# Patient Record
Sex: Male | Born: 1970
Health system: Southern US, Community
[De-identification: ages and names within clinical notes are randomized; demographics above are authoritative.]

## PROBLEM LIST (undated history)

## (undated) DIAGNOSIS — I4891 Unspecified atrial fibrillation: Secondary | ICD-10-CM

## (undated) DIAGNOSIS — N179 Acute kidney failure, unspecified: Secondary | ICD-10-CM

## (undated) DIAGNOSIS — E876 Hypokalemia: Secondary | ICD-10-CM

## (undated) HISTORY — PX: NO PAST SURGERIES: SHX2092

---

## 2016-02-23 DIAGNOSIS — I4891 Unspecified atrial fibrillation: Secondary | ICD-10-CM

## 2016-02-23 DIAGNOSIS — N179 Acute kidney failure, unspecified: Secondary | ICD-10-CM

## 2016-02-23 DIAGNOSIS — E876 Hypokalemia: Secondary | ICD-10-CM

## 2016-02-23 HISTORY — DX: Hypokalemia: E87.6

## 2016-02-23 HISTORY — DX: Acute kidney failure, unspecified: N17.9

## 2016-02-23 HISTORY — DX: Unspecified atrial fibrillation: I48.91

## 2016-03-15 ENCOUNTER — Encounter (HOSPITAL_BASED_OUTPATIENT_CLINIC_OR_DEPARTMENT_OTHER): Payer: Self-pay | Admitting: Emergency Medicine

## 2016-03-15 ENCOUNTER — Emergency Department (HOSPITAL_BASED_OUTPATIENT_CLINIC_OR_DEPARTMENT_OTHER)
Admission: EM | Admit: 2016-03-15 | Discharge: 2016-03-15 | Disposition: A | Discharge status: Home / Self Care | Payer: BLUE CROSS/BLUE SHIELD | Attending: Emergency Medicine | Admitting: Emergency Medicine

## 2016-03-15 DIAGNOSIS — R945 Abnormal results of liver function studies: Secondary | ICD-10-CM | POA: Diagnosis not present

## 2016-03-15 DIAGNOSIS — R112 Nausea with vomiting, unspecified: Secondary | ICD-10-CM | POA: Insufficient documentation

## 2016-03-15 DIAGNOSIS — R7989 Other specified abnormal findings of blood chemistry: Secondary | ICD-10-CM

## 2016-03-15 DIAGNOSIS — R109 Unspecified abdominal pain: Secondary | ICD-10-CM | POA: Diagnosis present

## 2016-03-15 LAB — CBC WITH DIFFERENTIAL/PLATELET
BASOS ABS: 0 10*3/uL (ref 0.0–0.1)
Basophils Relative: 0 %
EOS PCT: 0 %
Eosinophils Absolute: 0 10*3/uL (ref 0.0–0.7)
HCT: 39.9 % (ref 39.0–52.0)
HEMOGLOBIN: 13.7 g/dL (ref 13.0–17.0)
LYMPHS ABS: 0.7 10*3/uL (ref 0.7–4.0)
Lymphocytes Relative: 6 %
MCH: 30 pg (ref 26.0–34.0)
MCHC: 34.3 g/dL (ref 30.0–36.0)
MCV: 87.3 fL (ref 78.0–100.0)
MONOS PCT: 5 %
Monocytes Absolute: 0.6 10*3/uL (ref 0.1–1.0)
Neutro Abs: 10.6 10*3/uL — ABNORMAL HIGH (ref 1.7–7.7)
Neutrophils Relative %: 89 %
PLATELETS: 173 10*3/uL (ref 150–400)
RBC: 4.57 MIL/uL (ref 4.22–5.81)
RDW: 13.3 % (ref 11.5–15.5)
WBC: 11.9 10*3/uL — ABNORMAL HIGH (ref 4.0–10.5)

## 2016-03-15 LAB — URINE MICROSCOPIC-ADD ON: Bacteria, UA: NONE SEEN

## 2016-03-15 LAB — URINALYSIS, ROUTINE W REFLEX MICROSCOPIC
Bilirubin Urine: NEGATIVE
GLUCOSE, UA: NEGATIVE mg/dL
Ketones, ur: 40 mg/dL — AB
LEUKOCYTES UA: NEGATIVE
Nitrite: NEGATIVE
PROTEIN: 100 mg/dL — AB
SPECIFIC GRAVITY, URINE: 1.025 (ref 1.005–1.030)
pH: 6 (ref 5.0–8.0)

## 2016-03-15 LAB — COMPREHENSIVE METABOLIC PANEL
ALBUMIN: 4.5 g/dL (ref 3.5–5.0)
ALK PHOS: 106 U/L (ref 38–126)
ALT: 98 U/L — AB (ref 17–63)
AST: 124 U/L — ABNORMAL HIGH (ref 15–41)
Anion gap: 9 (ref 5–15)
BILIRUBIN TOTAL: 2.1 mg/dL — AB (ref 0.3–1.2)
BUN: 19 mg/dL (ref 6–20)
CALCIUM: 9.9 mg/dL (ref 8.9–10.3)
CO2: 24 mmol/L (ref 22–32)
CREATININE: 1.77 mg/dL — AB (ref 0.61–1.24)
Chloride: 104 mmol/L (ref 101–111)
GFR calc Af Amer: 51 mL/min — ABNORMAL LOW (ref >60)
GFR calc non Af Amer: 44 mL/min — ABNORMAL LOW (ref >60)
GLUCOSE: 134 mg/dL — AB (ref 65–99)
Potassium: 4.2 mmol/L (ref 3.5–5.1)
SODIUM: 137 mmol/L (ref 135–145)
TOTAL PROTEIN: 8 g/dL (ref 6.5–8.1)

## 2016-03-15 LAB — LIPASE, BLOOD: LIPASE: 18 U/L (ref 11–51)

## 2016-03-15 MED ORDER — ONDANSETRON 8 MG PO TBDP: 8.0000 mg | ORAL_TABLET | Freq: Three times a day (TID) | ORAL | Status: DC | PRN

## 2016-03-15 MED ORDER — ONDANSETRON HCL 4 MG/2ML IJ SOLN
4.0000 mg | Freq: Once | INTRAMUSCULAR | Status: AC
  Administered 2016-03-15: 4 mg via INTRAVENOUS
  Filled 2016-03-15: qty 2

## 2016-03-15 MED ORDER — FENTANYL CITRATE (PF) 100 MCG/2ML IJ SOLN
100.0000 ug | Freq: Once | INTRAMUSCULAR | Status: AC
  Administered 2016-03-15: 100 ug via INTRAVENOUS
  Filled 2016-03-15: qty 2

## 2016-03-15 MED ORDER — SODIUM CHLORIDE 0.9 % IV BOLUS (SEPSIS)
1000.0000 mL | Freq: Once | INTRAVENOUS | Status: AC
  Administered 2016-03-15: 1000 mL via INTRAVENOUS

## 2016-03-15 NOTE — ED Notes (Signed)
Patient states that he ate at a [place] today and since he has had N/v and right sided abdominal pain

## 2016-03-15 NOTE — ED Notes (Signed)
Tolerated oral trial without difficulty

## 2016-03-15 NOTE — ED Notes (Signed)
Pt given discharge instructions. Pt given opportunity for questions.

## 2016-03-15 NOTE — ED Provider Notes (Signed)
CSN: [id number]     Arrival date & time 03/15/16  0508 History   First MD Initiated Contact with Patient 03/15/16 0530     Chief Complaint  Patient presents with  . Abdominal Pain     (Consider location/radiation/quality/duration/timing/severity/associated sxs/prior Treatment) HPI  This is a 45 year old male who developed nausea yesterday afternoon about 4 PM. He attributes this to eating [place] food about 1:30 PM yesterday. He subsequently developed vomiting and has vomited at least 5 times. He is also having some right-sided abdominal pain which he describes as cramping or spasms. This pain is not worse with palpation. This is moderate in severity. He denies fever, chills, shortness of breath and diarrhea. He tried taking Tylenol for his pain but threw this up.  History reviewed. No pertinent past medical history. History reviewed. No pertinent past surgical history. History reviewed. No pertinent family history. Social History  Substance Use Topics  . Smoking status: Never Smoker   . Smokeless tobacco: None  . Alcohol Use: No    Review of Systems  All other systems reviewed and are negative.   Allergies  Review of patient's allergies indicates no known allergies.  Home Medications   Prior to Admission medications   Medication Sig Start Date End Date Taking? Authorizing Provider  ondansetron (ZOFRAN ODT) 8 MG disintegrating tablet Take 1 tablet (8 mg total) by mouth every 8 (eight) hours as needed for nausea or vomiting. 03/15/16   Shavonn Convey, MD   BP 189/122 mmHg  Pulse 70  Temp(Src) 98.3 F (36.8 C) (Oral)  Resp 20  Ht  (1.803 m)  Wt 210 lb (95.255 kg)  BMI 29.30 kg/m2  SpO2 100%   Physical Exam  General: Well-developed, well-nourished male in no acute distress; appearance consistent with age of record HENT: normocephalic; atraumatic Eyes: pupils equal, round and reactive to light; extraocular muscles intact Neck: supple Heart: regular rate and  rhythm Lungs: clear to auscultation bilaterally Abdomen: soft; nondistended; nontender, negative Murphy sign; no masses or hepatosplenomegaly; bowel sounds present Extremities: No deformity; full range of motion; pulses normal Neurologic: Awake, alert and oriented; motor function intact in all extremities and symmetric; no facial droop Skin: Warm and dry Psychiatric: Normal mood and affect    ED Course  Procedures (including critical care time)   MDM  Nursing notes and vitals signs, including pulse oximetry, reviewed.  Summary of this visit's results, reviewed by myself:  Labs:  Results for orders placed or performed during the hospital encounter of 03/15/16 (from the past 24 hour(s))  CBC with Differential/Platelet     Status: Abnormal   Collection Time: 03/15/16  5:40 AM  Result Value Ref Range   WBC 11.9 (H) 4.0 - 10.5 K/uL   RBC 4.57 4.22 - 5.81 MIL/uL   Hemoglobin 13.7 13.0 - 17.0 g/dL   HCT 11.9 14.7 - 82.9 %   MCV 87.3 78.0 - 100.0 fL   MCH 30.0 26.0 - 34.0 pg   MCHC 34.3 30.0 - 36.0 g/dL   RDW 56.2 13.0 - 86.5 %   Platelets 173 150 - 400 K/uL   Neutrophils Relative % 89 %   Neutro Abs 10.6 (H) 1.7 - 7.7 K/uL   Lymphocytes Relative 6 %   Lymphs Abs 0.7 0.7 - 4.0 K/uL   Monocytes Relative 5 %   Monocytes Absolute 0.6 0.1 - 1.0 K/uL   Eosinophils Relative 0 %   Eosinophils Absolute 0.0 0.0 - 0.7 K/uL   Basophils Relative 0 %  Basophils Absolute 0.0 0.0 - 0.1 K/uL  Comprehensive metabolic panel     Status: Abnormal   Collection Time: 03/15/16  5:40 AM  Result Value Ref Range   Sodium 137 135 - 145 mmol/L   Potassium 4.2 3.5 - 5.1 mmol/L   Chloride 104 101 - 111 mmol/L   CO2 24 22 - 32 mmol/L   Glucose, Bld 134 (H) 65 - 99 mg/dL   BUN 19 6 - 20 mg/dL   Creatinine, Ser 4.17 (H) 0.61 - 1.24 mg/dL   Calcium 9.9 8.9 - 40.8 mg/dL   Total Protein 8.0 6.5 - 8.1 g/dL   Albumin 4.5 3.5 - 5.0 g/dL   AST 144 (H) 15 - 41 U/L   ALT 98 (H) 17 - 63 U/L   Alkaline  Phosphatase 106 38 - 126 U/L   Total Bilirubin 2.1 (H) 0.3 - 1.2 mg/dL   GFR calc non Af Amer 44 (L) >60 mL/min   GFR calc Af Amer 51 (L) >60 mL/min   Anion gap 9 5 - 15  Lipase, blood     Status: None   Collection Time: 03/15/16  5:40 AM  Result Value Ref Range   Lipase 18 11 - 51 U/L  Urinalysis, Routine w reflex microscopic (not at [facility])     Status: Abnormal   Collection Time: 03/15/16  6:20 AM  Result Value Ref Range   Color, Urine YELLOW YELLOW   APPearance CLEAR CLEAR   Specific Gravity, Urine 1.025 1.005 - 1.030   pH 6.0 5.0 - 8.0   Glucose, UA NEGATIVE NEGATIVE mg/dL   Hgb urine dipstick LARGE (A) NEGATIVE   Bilirubin Urine NEGATIVE NEGATIVE   Ketones, ur 40 (A) NEGATIVE mg/dL   Protein, ur 818 (A) NEGATIVE mg/dL   Nitrite NEGATIVE NEGATIVE   Leukocytes, UA NEGATIVE NEGATIVE  Urine microscopic-add on     Status: Abnormal   Collection Time: 03/15/16  6:20 AM  Result Value Ref Range   Squamous Epithelial / LPF 0-5 (A) NONE SEEN   WBC, UA 0-5 0 - 5 WBC/hpf   RBC / HPF 6-30 0 - 5 RBC/hpf   Bacteria, UA NONE SEEN NONE SEEN   Urine-Other      RENAL TUBULAR EPI'S MAY BE PRESENT. SAMPLE TO BE SENT FOR PATH REVIEW.   6:55 AM Patient drinking fluids without emesis after IV Zofran and fluids. Advised of mildly elevated liver enzymes. Was advised to follow-up with primary care physician to have these reevaluated or to return to the ED if symptoms worsen.     Paula Libra, MD 03/15/16 2258

## 2016-03-15 NOTE — Discharge Instructions (Signed)
There were some abnormalities in you laboratory studies. You liver enzymes were mildly elevated. You need to follow up with a primary care physician to have this further evaluated.

## 2016-03-17 LAB — PATHOLOGIST SMEAR REVIEW

## 2016-03-18 DIAGNOSIS — R1011 Right upper quadrant pain: Secondary | ICD-10-CM | POA: Diagnosis not present

## 2016-03-19 ENCOUNTER — Observation Stay (HOSPITAL_BASED_OUTPATIENT_CLINIC_OR_DEPARTMENT_OTHER)
Admission: EM | Admit: 2016-03-19 | Discharge: 2016-03-21 | Disposition: A | Discharge status: Home / Self Care | Payer: BLUE CROSS/BLUE SHIELD | Attending: Internal Medicine | Admitting: Internal Medicine

## 2016-03-19 ENCOUNTER — Emergency Department (HOSPITAL_BASED_OUTPATIENT_CLINIC_OR_DEPARTMENT_OTHER): Payer: BLUE CROSS/BLUE SHIELD

## 2016-03-19 ENCOUNTER — Encounter (HOSPITAL_BASED_OUTPATIENT_CLINIC_OR_DEPARTMENT_OTHER): Payer: Self-pay | Admitting: *Deleted

## 2016-03-19 DIAGNOSIS — E876 Hypokalemia: Secondary | ICD-10-CM | POA: Diagnosis present

## 2016-03-19 DIAGNOSIS — R109 Unspecified abdominal pain: Secondary | ICD-10-CM | POA: Diagnosis present

## 2016-03-19 DIAGNOSIS — I4891 Unspecified atrial fibrillation: Principal | ICD-10-CM

## 2016-03-19 DIAGNOSIS — R509 Fever, unspecified: Secondary | ICD-10-CM | POA: Diagnosis not present

## 2016-03-19 DIAGNOSIS — H1132 Conjunctival hemorrhage, left eye: Secondary | ICD-10-CM | POA: Diagnosis not present

## 2016-03-19 DIAGNOSIS — R778 Other specified abnormalities of plasma proteins: Secondary | ICD-10-CM | POA: Diagnosis present

## 2016-03-19 DIAGNOSIS — D72829 Elevated white blood cell count, unspecified: Secondary | ICD-10-CM | POA: Diagnosis present

## 2016-03-19 DIAGNOSIS — N179 Acute kidney failure, unspecified: Secondary | ICD-10-CM

## 2016-03-19 DIAGNOSIS — R112 Nausea with vomiting, unspecified: Secondary | ICD-10-CM | POA: Diagnosis present

## 2016-03-19 DIAGNOSIS — R7989 Other specified abnormal findings of blood chemistry: Secondary | ICD-10-CM | POA: Diagnosis present

## 2016-03-19 HISTORY — DX: Hypokalemia: E87.6

## 2016-03-19 HISTORY — DX: Unspecified atrial fibrillation: I48.91

## 2016-03-19 HISTORY — DX: Acute kidney failure, unspecified: N17.9

## 2016-03-19 LAB — CBC WITH DIFFERENTIAL/PLATELET
Basophils Absolute: 0 10*3/uL (ref 0.0–0.1)
Basophils Relative: 0 %
EOS PCT: 3 %
Eosinophils Absolute: 0.3 10*3/uL (ref 0.0–0.7)
HCT: 40.8 % (ref 39.0–52.0)
Hemoglobin: 14.1 g/dL (ref 13.0–17.0)
LYMPHS ABS: 1.9 10*3/uL (ref 0.7–4.0)
LYMPHS PCT: 17 %
MCH: 29.3 pg (ref 26.0–34.0)
MCHC: 34.6 g/dL (ref 30.0–36.0)
MCV: 84.8 fL (ref 78.0–100.0)
MONO ABS: 1.4 10*3/uL — AB (ref 0.1–1.0)
MONOS PCT: 12 %
Neutro Abs: 7.7 10*3/uL (ref 1.7–7.7)
Neutrophils Relative %: 68 %
PLATELETS: 217 10*3/uL (ref 150–400)
RBC: 4.81 MIL/uL (ref 4.22–5.81)
RDW: 12.7 % (ref 11.5–15.5)
WBC: 11.3 10*3/uL — ABNORMAL HIGH (ref 4.0–10.5)

## 2016-03-19 LAB — COMPREHENSIVE METABOLIC PANEL
ALT: 60 U/L (ref 17–63)
ANION GAP: 13 (ref 5–15)
AST: 32 U/L (ref 15–41)
Albumin: 4 g/dL (ref 3.5–5.0)
Alkaline Phosphatase: 90 U/L (ref 38–126)
BUN: 24 mg/dL — ABNORMAL HIGH (ref 6–20)
CHLORIDE: 96 mmol/L — AB (ref 101–111)
CO2: 23 mmol/L (ref 22–32)
CREATININE: 2.11 mg/dL — AB (ref 0.61–1.24)
Calcium: 9.2 mg/dL (ref 8.9–10.3)
GFR, EST AFRICAN AMERICAN: 41 mL/min — AB (ref >60)
GFR, EST NON AFRICAN AMERICAN: 36 mL/min — AB (ref >60)
Glucose, Bld: 96 mg/dL (ref 65–99)
POTASSIUM: 3.4 mmol/L — AB (ref 3.5–5.1)
Sodium: 132 mmol/L — ABNORMAL LOW (ref 135–145)
Total Bilirubin: 1.7 mg/dL — ABNORMAL HIGH (ref 0.3–1.2)
Total Protein: 7.8 g/dL (ref 6.5–8.1)

## 2016-03-19 MED ORDER — DILTIAZEM HCL 100 MG IV SOLR
5.0000 mg/h | Freq: Once | INTRAVENOUS | Status: AC
  Administered 2016-03-19: 5 mg/h via INTRAVENOUS
  Filled 2016-03-19: qty 100

## 2016-03-19 MED ORDER — SODIUM CHLORIDE 0.9 % IV BOLUS (SEPSIS)
500.0000 mL | Freq: Once | INTRAVENOUS | Status: AC
  Administered 2016-03-19: 500 mL via INTRAVENOUS

## 2016-03-19 MED ORDER — DILTIAZEM HCL 100 MG IV SOLR: 5.0000 mg/h | Freq: Once | INTRAVENOUS | Status: DC

## 2016-03-19 MED ORDER — DILTIAZEM HCL 25 MG/5ML IV SOLN
20.0000 mg | Freq: Once | INTRAVENOUS | Status: AC
  Administered 2016-03-19: 10 mg via INTRAVENOUS

## 2016-03-19 NOTE — Progress Notes (Signed)
This is a no charge note   Transfer from [facility] per dr. Fredderick Phenix  45 year old man without significant past medical history, who presents with subjective fever and abnormal lab.  Pt was seen in the ED 4 days ago due to nausea, vomiting, abdominal pain over RUQ. He had negative lipase and negative urinalysis. His nausea, vomiting and abdominal pain had resolved. He was seen by PCP for follow up today, and had labs drawn at his PCP's office. He was told that his WBC was elevated; pt was then directed to the ED to be further evaluated. Pt states that he will have diaphoresis/hot flashes at night when trying to sleep. Pt had a fever of 100 F last night that has since resolved. No abdominal pain, chest pain, shortness breath, dizziness, symptoms of UTI, rash or unilateral weakness.  Pt was found to have Afib with RVR with HR up to 150s and cardzem gtt was started. Still no CP or SOB. WBC 11.3, temperature normal, Na=132, K3.4, AKI with cre 2.17. Total bilirubin 1.7, AST 32, ALT 60. Pt is accepted to SDU for observation.   Lorretta Harp, MD  [facility] Pager [phone]  If 7PM-7AM, please contact night-coverage [email or web address] Password [facility] 03/19/2016, 11:47 PM

## 2016-03-19 NOTE — ED Notes (Signed)
At the bedside. EKG redone r/t changes in the rate of and the patient rate. Patient remains tachycardic at 117 to 120 - AFlutter noted. Multiple PVCs noted and multifocal in nature. MD aware. Bolus of 10 given per the verbal orders and BP rechecked.

## 2016-03-19 NOTE — ED Notes (Signed)
Upon completion of EKG pt is noted to be in a-fib rate 130-140. Pt denies CP, SOB, lightheadedness, dizziness, or any physical sx at all.

## 2016-03-19 NOTE — ED Provider Notes (Signed)
[facility] Provider Note   CSN: [id number] Arrival date & time: 03/19/16  2211  By signing my name below, I, Phillis Haggis, attest that this documentation has been prepared under the direction and in the presence of Rolan Bucco, MD. Electronically Signed: Phillis Haggis, ED Scribe. 03/19/16. 10:40 PM.  First Provider Contact:  None    History   Chief Complaint Chief Complaint  Patient presents with  . Abnormal Lab   The history is provided by the patient. No language interpreter was used.   HPI Comments: Mark Ponce is a 45 y.o. male who presents to the Emergency Department complaining of abnormal labs. Pt was seen in the ED 4 days ago for emesis.  He thought it was related to something that he ate. He had a short bout of pain in his right upper quadrant but has not had any abdominal pain since that time. Pt has not had any vomiting since. No diarrhea. Labs were drawn at his PCP's office today and he was told that his WBC was elevated; pt was then directed to the ED to be further evaluated. Pt states that he will have diaphoresis/hot flashes at night when trying to sleep. Pt had a fever tmax 100 F last night that has since resolved. He denies hx of abdominal surgeries, chills, rhinorrhea, congestion, cough, nausea, vomiting, diarrhea, abdominal pain, chest pain, SOB, dizziness, lightheadedness, or rash.   History reviewed. No pertinent past medical history.  Patient Active Problem List   Diagnosis Date Noted  . Atrial fibrillation with RVR (HCC) 03/19/2016  . AKI (acute kidney injury) (HCC) 03/19/2016  . Atrial fibrillation (HCC) 03/19/2016    History reviewed. No pertinent surgical history.     Home Medications    Prior to Admission medications   Medication Sig Start Date End Date Taking? Authorizing Provider  ondansetron (ZOFRAN ODT) 8 MG disintegrating tablet Take 1 tablet (8 mg total) by mouth every 8 (eight) hours as needed for nausea or vomiting.  03/15/16   Paula Libra, MD    Family History History reviewed. No pertinent family history.  Social History Social History  Substance Use Topics  . Smoking status: Never Smoker  . Smokeless tobacco: Not on file  . Alcohol use No     Allergies   Review of patient's allergies indicates no known allergies.   Review of Systems Review of Systems  Constitutional: Positive for diaphoresis and fever. Negative for chills and fatigue.  HENT: Negative for congestion, rhinorrhea and sneezing.   Eyes: Negative.   Respiratory: Negative for cough, chest tightness and shortness of breath.   Cardiovascular: Negative for chest pain and leg swelling.  Gastrointestinal: Negative for abdominal pain, blood in stool, diarrhea, nausea and vomiting.  Genitourinary: Negative for difficulty urinating, flank pain, frequency and hematuria.  Musculoskeletal: Negative for arthralgias and back pain.  Skin: Negative for rash.  Neurological: Negative for dizziness, speech difficulty, weakness, light-headedness, numbness and headaches.   Physical Exam Updated Vital Signs BP 128/97 (BP Location: Right Arm)   Pulse (!) 123   Temp 98.6 F (37 C) (Oral)   Resp 12   Ht  (1.803 m)   Wt 220 lb (99.8 kg)   SpO2 100%   BMI 30.68 kg/m   Physical Exam  Constitutional: He is oriented to person, place, and time. He appears well-developed and well-nourished.  HENT:  Head: Normocephalic and atraumatic.  Eyes: Pupils are equal, round, and reactive to light. Left conjunctiva has a hemorrhage.  Small subconjunctival hemorrhage to the left eye  Neck: Normal range of motion. Neck supple.  Cardiovascular: Normal heart sounds.  An irregularly irregular rhythm present. Tachycardia present.   Pulmonary/Chest: Effort normal and breath sounds normal. No respiratory distress. He has no wheezes. He has no rales. He exhibits no tenderness.  Abdominal: Soft. Bowel sounds are normal. There is no tenderness. There is no  rebound and no guarding.  Musculoskeletal: Normal range of motion. He exhibits no edema.  Lymphadenopathy:    He has no cervical adenopathy.  Neurological: He is alert and oriented to person, place, and time.  Skin: Skin is warm and dry. No rash noted.  Psychiatric: He has a normal mood and affect.     ED Treatments / Results  DIAGNOSTIC STUDIES: Oxygen Saturation is 100% on RA, normal by my interpretation.    COORDINATION OF CARE: 10:35 PM-Discussed treatment plan which includes labs and EKG with pt at bedside and pt agreed to plan.    Labs (all labs ordered are listed, but only abnormal results are displayed) Results for orders placed or performed during the hospital encounter of 03/19/16  CBC with Differential  Result Value Ref Range   WBC 11.3 (H) 4.0 - 10.5 K/uL   RBC 4.81 4.22 - 5.81 MIL/uL   Hemoglobin 14.1 13.0 - 17.0 g/dL   HCT 16.1 09.6 - 04.5 %   MCV 84.8 78.0 - 100.0 fL   MCH 29.3 26.0 - 34.0 pg   MCHC 34.6 30.0 - 36.0 g/dL   RDW 40.9 81.1 - 91.4 %   Platelets 217 150 - 400 K/uL   Neutrophils Relative % 68 %   Neutro Abs 7.7 1.7 - 7.7 K/uL   Lymphocytes Relative 17 %   Lymphs Abs 1.9 0.7 - 4.0 K/uL   Monocytes Relative 12 %   Monocytes Absolute 1.4 (H) 0.1 - 1.0 K/uL   Eosinophils Relative 3 %   Eosinophils Absolute 0.3 0.0 - 0.7 K/uL   Basophils Relative 0 %   Basophils Absolute 0.0 0.0 - 0.1 K/uL  Comprehensive metabolic panel  Result Value Ref Range   Sodium 132 (L) 135 - 145 mmol/L   Potassium 3.4 (L) 3.5 - 5.1 mmol/L   Chloride 96 (L) 101 - 111 mmol/L   CO2 23 22 - 32 mmol/L   Glucose, Bld 96 65 - 99 mg/dL   BUN 24 (H) 6 - 20 mg/dL   Creatinine, Ser 7.82 (H) 0.61 - 1.24 mg/dL   Calcium 9.2 8.9 - 95.6 mg/dL   Total Protein 7.8 6.5 - 8.1 g/dL   Albumin 4.0 3.5 - 5.0 g/dL   AST 32 15 - 41 U/L   ALT 60 17 - 63 U/L   Alkaline Phosphatase 90 38 - 126 U/L   Total Bilirubin 1.7 (H) 0.3 - 1.2 mg/dL   GFR calc non Af Amer 36 (L) >60 mL/min   GFR calc  Af Amer 41 (L) >60 mL/min   Anion gap 13 5 - 15   Dg Chest Port 1 View  Result Date: 03/19/2016 CLINICAL DATA:  45 year old male with atrial fibrillation. No chest complaints. EXAM: PORTABLE CHEST 1 VIEW COMPARISON:  None FINDINGS: Single portable view of the chest demonstrates clear lungs. There is no pleural effusion or pneumothorax. Top-normal cardiac silhouette. No acute osseous pathology. IMPRESSION: No active disease. Electronically Signed   By: Elgie Collard M.D.   On: 03/19/2016 23:26    EKG  EKG Interpretation  Date/Time:  Wednesday March 19 2016 23:03:17 EDT Ventricular Rate:  117 PR Interval:    QRS Duration: 81 QT Interval:  311 QTC Calculation: 434 R Axis:   38 Text Interpretation:  Atrial fibrillation Paired ventricular premature complexes Probable LVH with secondary repol abnrm Anterior ST elevation, probably due to LVH since last tracing no significant change from EKG from same day Confirmed by Sina Sumpter  MD, Marijo Quizon ([id number]) on 03/19/2016 11:07:29 PM       Radiology Dg Chest Port 1 View  Result Date: 03/19/2016 CLINICAL DATA:  45 year old male with atrial fibrillation. No chest complaints. EXAM: PORTABLE CHEST 1 VIEW COMPARISON:  None FINDINGS: Single portable view of the chest demonstrates clear lungs. There is no pleural effusion or pneumothorax. Top-normal cardiac silhouette. No acute osseous pathology. IMPRESSION: No active disease. Electronically Signed   By: Elgie Collard M.D.   On: 03/19/2016 23:26   Procedures Procedures (including critical care time)  Medications Ordered in ED Medications  sodium chloride 0.9 % bolus 500 mL (500 mLs Intravenous New Bag/Given 03/19/16 2311)  diltiazem (CARDIZEM) injection 20 mg (10 mg Intravenous Bolus 03/19/16 2313)  diltiazem (CARDIZEM) 100 mg in dextrose 5 % 100 mL (1 mg/mL) infusion (5 mg/hr Intravenous New Bag/Given 03/19/16 2311)     Initial Impression / Assessment and Plan / ED Course  I have reviewed the triage  vital signs and the nursing notes.  Pertinent labs & imaging results that were available during my care of the patient were reviewed by me and considered in my medical decision making (see chart for details).  Clinical Course    Patient presents as a follow-up as he was told that his white blood cell count was elevated. Otherwise he has no abdominal pain. No ongoing vomiting or diarrhea. On exam he was noted to be tachycardic and EKG was performed. EKG shows atrial fibrillation with RVR. He denies any known history of A. fib. He statesthat he has been feeling intermittent palpitations but can't pinpoint when they started. He denies any chest pain shortness of breath or dizziness. I will go ahead and start him on Cardizem and consult the hospitalist to transfer to [facility]. Of note his creatinine is elevated as compared to his previous ED visit 4 days ago. I will give him some IV fluids as well.  23:20 pt given cardizem bolus, started on drip.  HR down into 80s, still in a-fib. 23:43 Discussed with Dr. Youlanda Roys who will admit pt  CRITICAL CARE Performed by: Rebecca Motta Total critical care time: 45 minutes Critical care time was exclusive of separately billable procedures and treating other patients. Critical care was necessary to treat or prevent imminent or life-threatening deterioration. Critical care was time spent personally by me on the following activities: development of treatment plan with patient and/or surrogate as well as nursing, discussions with consultants, evaluation of patient's response to treatment, examination of patient, obtaining history from patient or surrogate, ordering and performing treatments and interventions, ordering and review of laboratory studies, ordering and review of radiographic studies, pulse oximetry and re-evaluation of patient's condition.   This patients CHA2DS2-VASc Score and unadjusted Ischemic Stroke Rate (% per year) is equal to 0.2 % stroke rate/year  from a score of 0  Above score calculated as 1 point each if present [CHF, HTN, DM, Vascular=MI/PAD/Aortic Plaque, Age if 65-74, or Male] Above score calculated as 2 points each if present [Age > 75, or Stroke/TIA/TE]    Final Clinical Impressions(s) / ED Diagnoses   Final diagnoses:  New  onset atrial fibrillation (HCC)  AKI (acute kidney injury) (HCC)  I personally performed the services described in this documentation, which was scribed in my presence.  The recorded information has been reviewed and considered.   New Prescriptions New Prescriptions   No medications on file     Rolan Bucco, MD 03/19/16 [phone]

## 2016-03-19 NOTE — ED Triage Notes (Addendum)
Pt seen here 4 days ago, f/u with PMD yesterday and labs drawn. PMD instructed pt to come to ED for increased WBC, pt has no complaints

## 2016-03-20 ENCOUNTER — Observation Stay (HOSPITAL_COMMUNITY): Payer: BLUE CROSS/BLUE SHIELD

## 2016-03-20 ENCOUNTER — Observation Stay (HOSPITAL_BASED_OUTPATIENT_CLINIC_OR_DEPARTMENT_OTHER): Payer: BLUE CROSS/BLUE SHIELD

## 2016-03-20 ENCOUNTER — Encounter (HOSPITAL_COMMUNITY): Payer: Self-pay | Admitting: *Deleted

## 2016-03-20 DIAGNOSIS — R7989 Other specified abnormal findings of blood chemistry: Secondary | ICD-10-CM

## 2016-03-20 DIAGNOSIS — I4891 Unspecified atrial fibrillation: Secondary | ICD-10-CM

## 2016-03-20 DIAGNOSIS — R109 Unspecified abdominal pain: Secondary | ICD-10-CM | POA: Diagnosis present

## 2016-03-20 DIAGNOSIS — R509 Fever, unspecified: Secondary | ICD-10-CM | POA: Diagnosis not present

## 2016-03-20 DIAGNOSIS — E876 Hypokalemia: Secondary | ICD-10-CM | POA: Diagnosis not present

## 2016-03-20 DIAGNOSIS — N179 Acute kidney failure, unspecified: Secondary | ICD-10-CM | POA: Diagnosis not present

## 2016-03-20 DIAGNOSIS — R112 Nausea with vomiting, unspecified: Secondary | ICD-10-CM | POA: Diagnosis present

## 2016-03-20 DIAGNOSIS — I517 Cardiomegaly: Secondary | ICD-10-CM | POA: Diagnosis not present

## 2016-03-20 DIAGNOSIS — R778 Other specified abnormalities of plasma proteins: Secondary | ICD-10-CM | POA: Diagnosis present

## 2016-03-20 LAB — URINALYSIS, ROUTINE W REFLEX MICROSCOPIC
BILIRUBIN URINE: NEGATIVE
GLUCOSE, UA: NEGATIVE mg/dL
KETONES UR: 15 mg/dL — AB
Nitrite: NEGATIVE
PH: 5.5 (ref 5.0–8.0)
Protein, ur: 30 mg/dL — AB
SPECIFIC GRAVITY, URINE: 1.013 (ref 1.005–1.030)

## 2016-03-20 LAB — RAPID URINE DRUG SCREEN, HOSP PERFORMED
AMPHETAMINES: NOT DETECTED
BARBITURATES: NOT DETECTED
BENZODIAZEPINES: NOT DETECTED
COCAINE: NOT DETECTED
OPIATES: NOT DETECTED
TETRAHYDROCANNABINOL: NOT DETECTED

## 2016-03-20 LAB — BASIC METABOLIC PANEL
Anion gap: 13 (ref 5–15)
BUN: 19 mg/dL (ref 6–20)
CHLORIDE: 102 mmol/L (ref 101–111)
CO2: 19 mmol/L — ABNORMAL LOW (ref 22–32)
CREATININE: 1.87 mg/dL — AB (ref 0.61–1.24)
Calcium: 9.3 mg/dL (ref 8.9–10.3)
GFR, EST AFRICAN AMERICAN: 48 mL/min — AB (ref >60)
GFR, EST NON AFRICAN AMERICAN: 41 mL/min — AB (ref >60)
Glucose, Bld: 86 mg/dL (ref 65–99)
Potassium: 4 mmol/L (ref 3.5–5.1)
SODIUM: 134 mmol/L — AB (ref 135–145)

## 2016-03-20 LAB — URINE MICROSCOPIC-ADD ON

## 2016-03-20 LAB — CBC
HCT: 42.1 % (ref 39.0–52.0)
Hemoglobin: 14.4 g/dL (ref 13.0–17.0)
MCH: 29.6 pg (ref 26.0–34.0)
MCHC: 34.2 g/dL (ref 30.0–36.0)
MCV: 86.4 fL (ref 78.0–100.0)
PLATELETS: 230 10*3/uL (ref 150–400)
RBC: 4.87 MIL/uL (ref 4.22–5.81)
RDW: 13.1 % (ref 11.5–15.5)
WBC: 11.5 10*3/uL — AB (ref 4.0–10.5)

## 2016-03-20 LAB — LIPID PANEL
CHOL/HDL RATIO: 5.8 ratio
CHOLESTEROL: 184 mg/dL (ref 0–200)
HDL: 32 mg/dL — ABNORMAL LOW (ref >40)
LDL Cholesterol: 134 mg/dL — ABNORMAL HIGH (ref 0–99)
Triglycerides: 88 mg/dL (ref <150)
VLDL: 18 mg/dL (ref 0–40)

## 2016-03-20 LAB — PROTIME-INR
INR: 1.26
Prothrombin Time: 15.9 seconds — ABNORMAL HIGH (ref 11.4–15.2)

## 2016-03-20 LAB — LACTIC ACID, PLASMA
LACTIC ACID, VENOUS: 0.9 mmol/L (ref 0.5–1.9)
Lactic Acid, Venous: 1.3 mmol/L (ref 0.5–1.9)

## 2016-03-20 LAB — MAGNESIUM: Magnesium: 2.4 mg/dL (ref 1.7–2.4)

## 2016-03-20 LAB — TROPONIN I
TROPONIN I: 0.6 ng/mL — AB (ref <0.03)
TROPONIN I: 0.59 ng/mL — AB (ref <0.03)
Troponin I: 0.71 ng/mL (ref <0.03)

## 2016-03-20 LAB — T4, FREE: FREE T4: 1.62 ng/dL — AB (ref 0.61–1.12)

## 2016-03-20 LAB — TSH: TSH: 1.688 u[IU]/mL (ref 0.350–4.500)

## 2016-03-20 LAB — APTT: aPTT: 36 seconds (ref 24–36)

## 2016-03-20 LAB — ECHOCARDIOGRAM COMPLETE
HEIGHTINCHES: 71 in
Weight: 3577.6 oz

## 2016-03-20 LAB — CREATININE, URINE, RANDOM: CREATININE, URINE: 81.86 mg/dL

## 2016-03-20 LAB — MRSA PCR SCREENING: MRSA BY PCR: NEGATIVE

## 2016-03-20 LAB — SODIUM, URINE, RANDOM: Sodium, Ur: 20 mmol/L

## 2016-03-20 LAB — HIV ANTIBODY (ROUTINE TESTING W REFLEX): HIV Screen 4th Generation wRfx: NONREACTIVE

## 2016-03-20 LAB — GLUCOSE, CAPILLARY: Glucose-Capillary: 86 mg/dL (ref 65–99)

## 2016-03-20 LAB — HEPARIN LEVEL (UNFRACTIONATED): HEPARIN UNFRACTIONATED: 0.39 [IU]/mL (ref 0.30–0.70)

## 2016-03-20 LAB — PROCALCITONIN: PROCALCITONIN: 0.15 ng/mL

## 2016-03-20 LAB — BRAIN NATRIURETIC PEPTIDE: B Natriuretic Peptide: 657.5 pg/mL — ABNORMAL HIGH (ref 0.0–100.0)

## 2016-03-20 MED ORDER — DILTIAZEM HCL ER COATED BEADS 240 MG PO CP24
240.0000 mg | ORAL_CAPSULE | Freq: Every day | ORAL | Status: DC
  Administered 2016-03-20 – 2016-03-21 (×2): 240 mg via ORAL
  Filled 2016-03-20 (×2): qty 1

## 2016-03-20 MED ORDER — ONDANSETRON HCL 4 MG PO TABS: 4.0000 mg | ORAL_TABLET | Freq: Four times a day (QID) | ORAL | Status: DC | PRN

## 2016-03-20 MED ORDER — OFF THE BEAT BOOK
Freq: Once | Status: AC
  Administered 2016-03-20: 1
  Filled 2016-03-20: qty 1

## 2016-03-20 MED ORDER — HYDRALAZINE HCL 20 MG/ML IJ SOLN: 5.0000 mg | INTRAMUSCULAR | Status: DC | PRN

## 2016-03-20 MED ORDER — ACETAMINOPHEN 325 MG PO TABS: 650.0000 mg | ORAL_TABLET | Freq: Four times a day (QID) | ORAL | Status: DC | PRN

## 2016-03-20 MED ORDER — HEPARIN (PORCINE) IN NACL 100-0.45 UNIT/ML-% IJ SOLN
1400.0000 [IU]/h | INTRAMUSCULAR | Status: DC
  Administered 2016-03-20: 1400 [IU]/h via INTRAVENOUS
  Filled 2016-03-20: qty 250

## 2016-03-20 MED ORDER — SODIUM CHLORIDE 0.9 % IV SOLN
INTRAVENOUS | Status: DC
  Administered 2016-03-20: 05:00:00 via INTRAVENOUS

## 2016-03-20 MED ORDER — ASPIRIN 325 MG PO TABS
325.0000 mg | ORAL_TABLET | Freq: Every day | ORAL | Status: DC
  Administered 2016-03-20: 325 mg via ORAL
  Filled 2016-03-20: qty 1

## 2016-03-20 MED ORDER — RIVAROXABAN 20 MG PO TABS
20.0000 mg | ORAL_TABLET | Freq: Every day | ORAL | Status: DC
  Administered 2016-03-20: 20 mg via ORAL
  Filled 2016-03-20: qty 1

## 2016-03-20 MED ORDER — ACETAMINOPHEN 650 MG RE SUPP: 650.0000 mg | Freq: Four times a day (QID) | RECTAL | Status: DC | PRN

## 2016-03-20 MED ORDER — HEPARIN BOLUS VIA INFUSION
4000.0000 [IU] | Freq: Once | INTRAVENOUS | Status: AC
  Administered 2016-03-20: 4000 [IU] via INTRAVENOUS
  Filled 2016-03-20: qty 4000

## 2016-03-20 MED ORDER — CARVEDILOL 3.125 MG PO TABS
3.1250 mg | ORAL_TABLET | Freq: Two times a day (BID) | ORAL | Status: DC
  Administered 2016-03-20 – 2016-03-21 (×3): 3.125 mg via ORAL
  Filled 2016-03-20 (×3): qty 1

## 2016-03-20 MED ORDER — DILTIAZEM HCL-DEXTROSE 100-5 MG/100ML-% IV SOLN (PREMIX)
5.0000 mg/h | INTRAVENOUS | Status: DC
  Administered 2016-03-20: 5 mg/h via INTRAVENOUS

## 2016-03-20 MED ORDER — SODIUM CHLORIDE 0.9% FLUSH
3.0000 mL | Freq: Two times a day (BID) | INTRAVENOUS | Status: DC
  Administered 2016-03-20 – 2016-03-21 (×2): 3 mL via INTRAVENOUS

## 2016-03-20 MED ORDER — ONDANSETRON HCL 4 MG/2ML IJ SOLN: 4.0000 mg | Freq: Four times a day (QID) | INTRAMUSCULAR | Status: DC | PRN

## 2016-03-20 MED ORDER — POTASSIUM CHLORIDE 20 MEQ/15ML (10%) PO SOLN
40.0000 meq | Freq: Once | ORAL | Status: AC
Start: 2016-03-20 — End: 2016-03-20
  Administered 2016-03-20: 40 meq via ORAL
  Filled 2016-03-20: qty 30

## 2016-03-20 NOTE — Progress Notes (Signed)
Patient is having occasional 2 second pauses. Strips saved in EPIC. Patient asymptomatic. Will continue to monitor.

## 2016-03-20 NOTE — Progress Notes (Signed)
ANTICOAGULATION CONSULT NOTE - Initial Consult  Pharmacy Consult for xarelto  Indication: atrial fibrillation  No Known Allergies  Patient Measurements: Height: 5\' 11"  (180.3 cm) Weight: 223 lb 9.6 oz (101.4 kg) IBW/kg (Calculated) : 75.3 Heparin Dosing Weight: 95kg  Vital Signs: Temp: 98 F (36.7 C) (07/27 0842) Temp Source: Oral (07/27 0311) BP: 114/68 (07/27 1148) Pulse Rate: 75 (07/27 0311)  Labs:  Recent Labs  03/19/16 2228 03/20/16 0434 03/20/16 0948  HGB 14.1 14.4  --   HCT 40.8 42.1  --   PLT 217 230  --   APTT  --  36  --   LABPROT  --  15.9*  --   INR  --  1.26  --   HEPARINUNFRC  --   --  0.39  CREATININE 2.11* 1.87*  --   TROPONINI  --  0.71* 0.60*    Estimated Creatinine Clearance: 59.2 mL/min (by C-G formula based on SCr of 1.87 mg/dL).   Medical History: Past Medical History:  Diagnosis Date  . AKI (acute kidney injury) (HCC) 02/2016  . Hypokalemia 02/2016  . New onset atrial fibrillation (HCC) 02/2016    Medications:  Prescriptions Prior to Admission  Medication Sig Dispense Refill Last Dose  . acetaminophen (TYLENOL) 325 MG tablet Take 650 mg by mouth every 6 (six) hours as needed.   03/19/2016 at Unknown time  . ondansetron (ZOFRAN ODT) 8 MG disintegrating tablet Take 1 tablet (8 mg total) by mouth every 8 (eight) hours as needed for nausea or vomiting. 10 tablet 0 Past Week at Unknown time   Scheduled:  . aspirin  325 mg Oral Daily  . carvedilol  3.125 mg Oral BID WC  . diltiazem  240 mg Oral Daily  . rivaroxaban  20 mg Oral Q supper  . sodium chloride flush  3 mL Intravenous Q12H   Infusions:     Assessment: 45yo male found to be in Afib w/ RVR with seemingly recent onset. Transitioning from therapeutic heparin to Xarelto. No PTA anti-coag - INR on admission 1.26. Current CrCl ~60 mL/min - unsure of baseline. CBC WNL and stable. No bleeding reported.  Goal of Therapy:  Heparin level 0.3-0.7 units/ml Monitor platelets by  anticoagulation protocol: Yes   Plan:  -D/C heparin and start Xarelto 20mg  daily -Monitor CBC, s/sx bleeding   Sherle Poe, PharmD Clinical Pharmacist  11:57 AM, 03/20/2016

## 2016-03-20 NOTE — Consult Note (Signed)
CARDIOLOGY CONSULT NOTE   Patient ID: Mark Ponce MRN: [id number] DOB/AGE: 1971/04/16 45 y.o.  Admit date: 03/19/2016  Primary Physician   Neldon Labella, MD Primary Cardiologist   New, Dr Elaysia Devargas Reason for Consultation   Atrial fib, CP & elevated troponin Requesting MD: Dr Clyde Lundborg  [id number] Mark Ponce is a 45 y.o. year old male with no cardiac history, no ongoing medical issues, no surgeries. Has not had a physical in a long time, but has a primary MD he sees occasionally.  Pt had N&V and abdominal pain after [place] food on 07/21. Seen in ER 07/22, treated and released.    During this, he noticed his heart beating hard and fast but in the ER, his HR was normal, no ECG performed.  His symptoms improved, but he was having episodic fevers and sweats. Because of this, he went to his primary MD's office. He was to take Tylenol. No mention of a rapid or irregular HR. Labs were drawn.  Pt received a call about his labs yesterday pm, being told they were abnormal and he needed to go back to the ER. He went back to [facility] and was transferred to [facility] for admission because of his abnormal rhythm, elevated white count and decreased renal function. He also burst a blood vessel in his L eye during all the vomiting.   He has no real awareness of an irregular HR. He has felt his heart racing, worst in the last 24 hours. No presyncope or syncope.    Past Medical History:  Diagnosis Date  . AKI (acute kidney injury) (HCC) 02/2016  . Hypokalemia 02/2016  . New onset atrial fibrillation (HCC) 02/2016     Past Surgical History:  Procedure Laterality Date  . NO PAST SURGERIES      No Known Allergies  I have reviewed the patient's current medications . aspirin  325 mg Oral Daily  . carvedilol  3.125 mg Oral BID WC  . sodium chloride flush  3 mL Intravenous Q12H   . diltiazem (CARDIZEM) infusion 10 mg/hr (03/20/16 0436)  . heparin 1,400 Units/hr (03/20/16 0429)    acetaminophen **OR** acetaminophen  Prior to Admission medications   Medication Sig Start Date End Date Taking? Authorizing Provider  acetaminophen (TYLENOL) 325 MG tablet Take 650 mg by mouth every 6 (six) hours as needed.   Yes Historical Provider, MD  ondansetron (ZOFRAN ODT) 8 MG disintegrating tablet Take 1 tablet (8 mg total) by mouth every 8 (eight) hours as needed for nausea or vomiting. 03/15/16  Yes Paula Libra, MD     Social History   Social History  . Marital status: Single    Spouse name: N/A  . Number of children: N/A  . 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 Smoking status: Never Smoker  . Smokeless tobacco: Never Used  . Alcohol use No  . Drug use: No  . Sexual activity: Not on file   Other Topics Concern  . Not on file   Social History Narrative   Pt lives alone in [place].    Family Status  Relation Status  . Mother Alive  . Father Alive   History reviewed. No pertinent family history.   ROS:  Full 14 point review of systems complete and found to be negative unless listed above.  Physical Exam: Blood pressure 114/82, pulse 75, temperature 98 F (36.7 Mark),  resp. rate 11, height  (1.803 m), weight 223 lb 9.6 oz (101.4 kg), SpO2 99 %.  General: Well developed, well nourished, male in no acute distress Head: Eyes PERRLA, No xanthomas.   Normocephalic and atraumatic, oropharynx without edema or exudate. Dentition: good Lungs: Clear bilaterally Heart: Heart irregular rate and rhythm with S1, S2  murmur. pulses are 2+ all 4 extrem.   Neck: No carotid bruits. No lymphadenopathy.  JVD not elevated Abdomen: Bowel sounds present, abdomen soft and non-tender without masses or hernias noted. Msk:  No spine or cva tenderness. No weakness, no joint deformities or effusions. Extremities: No clubbing or cyanosis. No edema.  Neuro: Alert and oriented X 3. No focal deficits  noted. Psych:  Good affect, responds appropriately Skin: No rashes or lesions noted.  Labs:   Lab Results  Component Value Date   WBC 11.5 (H) 03/20/2016   HGB 14.4 03/20/2016   HCT 42.1 03/20/2016   MCV 86.4 03/20/2016   PLT 230 03/20/2016    Recent Labs  03/20/16 0434  INR 1.26     Recent Labs Lab 03/19/16 2228 03/20/16 0434  NA 132* 134*  K 3.4* 4.0  CL 96* 102  CO2 23 19*  BUN 24* 19  CREATININE 2.11* 1.87*  CALCIUM 9.2 9.3  PROT 7.8  --   BILITOT 1.7*  --   ALKPHOS 90  --   ALT 60  --   AST 32  --   GLUCOSE 96 86  ALBUMIN 4.0  --    Magnesium  Date Value Ref Range Status  03/20/2016 2.4 1.7 - 2.4 mg/dL Final    Recent Labs  16/10/96 0434 03/20/16 0948  TROPONINI 0.71* 0.60*   B Natriuretic Peptide  Date/Time Value Ref Range Status  03/20/2016 04:35 AM 657.5 (H) 0.0 - 100.0 pg/mL Final   Lab Results  Component Value Date   CHOL 184 03/20/2016   HDL 32 (L) 03/20/2016   LDLCALC 134 (H) 03/20/2016   TRIG 88 03/20/2016   Lipase  Date/Time Value Ref Range Status  03/15/2016 05:40 AM 18 11 - 51 U/L Final   TSH  Date/Time Value Ref Range Status  03/20/2016 04:35 AM 1.688 0.350 - 4.500 uIU/mL Final   Echo: pending   ECG:  Atrial fib, HR 139  Radiology:  Dg Chest 2 View Result Date: 03/20/2016 CLINICAL DATA:  Fever. EXAM: CHEST  2 VIEW COMPARISON:  03/19/2016. FINDINGS: Mediastinum hilar structures normal. Stable cardiomegaly. Mild left base subsegmental atelectasis. Questionable pulmonary nodules in the apices bilaterally, most likely overlying structures. Follow-up PA and lateral chest x-ray is suggested to demonstrate resolution . No pleural effusion or pneumothorax. No acute bony abnormality. IMPRESSION: 1. Cardiomegaly.  No pulmonary venous congestion. 2. Questionable pulmonary nodules noted projected over the apices, most likely overlapping structures. Follow-up PA and lateral chest x-ray suggested. Mild left base subsegmental atelectasis.  No focal pulmonary infiltrate. Electronically Signed   By: Maisie Fus  Register   On: 03/20/2016 06:54  US Renal Result Date: 03/20/2016 CLINICAL DATA:  45 year old male with increased BUN and creatinine. Acute renal insufficiency. EXAM: RENAL / URINARY TRACT ULTRASOUND COMPLETE COMPARISON:  None. FINDINGS: Evaluation is limited due to portable technique. Right Kidney: Length: 12 cm. There is diffuse increased echotexture. No hydronephrosis or echogenic stone. Left Kidney: Length: 13 cm. There is a large area of defect and indentation of the upper pole parenchyma may represent scarring. There is diffuse increased echotexture. No hydronephrosis or echogenic stone. Bladder: Appears normal for  degree of bladder distention. IMPRESSION: No hydronephrosis or echogenic stone. Increased renal echotexture, likely related to underlying medical renal disease. Clinical correlation is recommended. Electronically Signed   By: Elgie Collard M.D.   On: 03/20/2016 05:17  Dg Chest Port 1 View Result Date: 03/19/2016 CLINICAL DATA:  45 year old male with atrial fibrillation. No chest complaints. EXAM: PORTABLE CHEST 1 VIEW COMPARISON:  None FINDINGS: Single portable view of the chest demonstrates clear lungs. There is no pleural effusion or pneumothorax. Top-normal cardiac silhouette. No acute osseous pathology. IMPRESSION: No active disease. Electronically Signed   By: Elgie Collard M.D.   On: 03/19/2016 23:26   ASSESSMENT AND PLAN:   The patient was seen today by Dr Royann Shivers, the patient evaluated and the data reviewed.   Principal Problem:   Atrial fibrillation with RVR (HCC) - persistent - unknown duration, but likely only for the last few days. - unable to be completely sure if started within 48 hours. - TSH is normal - Rate control improved on IV Cardizem and low-dose Coreg - echo ordered, will try to get done today. - feel this is from acute illness, will try to restore SR - schedule TEE/DCCV for tomorrow  as long as EF is normal - CHADS2VASC is probably 1 as BP has been elevated since admission and pt has been told his BP was elevated in the past. - no known hx OSA  Otherwise, per IM, will start oral anticoagulation and change Cardizem to PO. Active Problems:   AKI (acute kidney injury) (HCC)   New onset atrial fibrillation (HCC)   Fever, unspecified   Hypokalemia   Nausea with vomiting   Abdominal pain    Signed: Leanna Battles 03/20/2016 11:06 AM Beeper [phone]  Co-Sign MD  I have seen and examined the patient along with Barrett, Bjorn Loser, PA-Mark.  I have reviewed the chart, notes and new data.  I agree with PA's note.  Key new complaints: asymptomatic until last few days, palpitations first noted about 3 days prior to admission, preceded by acute GI illness. No previously known cardiac risk factors other than "borderline high BP" and obesity. Adverse lipid profile noted on this admission (low HDL, mildly high LDL). No hypersomnolence, angina, dyspnea, syncope, edema, heat-cold intolerance, weight changes, etc. Key examination changes: irregular rhythm, otherwise normal exam Key new findings / data: hyperechoic kidneys on [place]; marginal increase in cardiac troponin (without typical "rise and fall" pattern) and elevated BNP.  PLAN: Persistent AF, now rate controlled. CHADSVasc score may be as low as zero (if his BP is really usually normal), but further evaluation for HTN and CAD is necessary (may be 2 or 3 depending on presence/absence of CAD and LV dysfunction)  Transition from IV to oral diltiazem and from IV heparin to direct oral anticoagulant. Check echo. If regional wall motion abnormalities or markedly decreased LVEF found, will need coronary angiography. Otherwise prefer outpatient stress Myoview after rhythm has normalized. If there is evidence of right heart enlargement or pulmonary HTN, plan sleep study. If echo is normal, still recommend weight loss. Lipid profile  and risk of future arrhythmia will both improve with weight loss and exercise. If he remains in AF tomorrow, plan TEE-DCCV. At this point, the schedule for tomorrow is full. If his procedure cannot be completed tomorrow, can bring him back for an outpatient TEE-DCCV next week. Duration of recommended anticoagulation depends heavily on echo findings. Would plan 30 days DOAC after cardioversion if echo is normal, lifelong if there is  LV dysfunction or substantial LA enlargement. Renal function is improving, but US findings are of concern. He may have more longstanding HTN than we think or his HTN may be secondary to intrinsic renal disease.  Thurmon Fair, MD, [facility] [phone] 03/20/2016, 11:44 AM

## 2016-03-20 NOTE — H&P (Addendum)
History and Physical    Mark Ponce YYT:[id number] DOB: [birth year] DOA: 03/19/2016  Referring MD/NP/PA:   PCP: Neldon Labella, MD   Patient coming from:  The patient is coming from home.  At baseline, pt is independent for most of ADL.   Chief Complaint: fever and diaphoresis  HPI: Mark Ponce is a 45 y.o. male without significant medical history, who presents with fever and diaphoresis.  Pt reports that he had nausea, vomiting and abdominal pain over RUQ after eating some [place] food 4 days ago. Pt was seen in the ED 4 days ago. He had negative lipase and negative urinalysis. His nausea, vomiting and abdominal pain resolved completely. After that, pt states that he will have diaphoresis/hot flashesat night when trying to sleep. No hx of incarceration. No contact with any person with tuberculosis. No family members having tuberculosis. No recent traveling abroad. No weight loss. He was seen by his PCP. Pt had a fever of 100 F in PCP's office. He had labs drawn at his PCP's office. He was told that his WBC was elevated; pt was then directed to the ED to be further evaluated. Of note, pt states that he had eye vessel burst due to severe nausea and vomiting 4 days ago. He has redeye in left eye.  Patient denies respiratory symptoms, no cough, SOB, runny nose or sore throat. No chest pain, no symptoms of UTI, rashes, unilateral weakness. No recurrent nausea, vomiting, abdominal pain.  ED Course: pt was found to have Afib with RVR with HR up to 150s and cardzem gtt was started. WBC 11.3, temperature normal, Na=132, K3.4, AKI with cre 2.17. Total bilirubin 1.7, AST 32, ALT 60. Pt is placed on SDU for observation.   Review of Systems:   General: has fevers and diaphoresis, no chills, no changes in body weight, has fatigue HEENT: no blurry vision, hearing changes or sore throat. Has left redeye. Pulm: no dyspnea, coughing, wheezing CV: no chest pain, no palpitations Abd: currently no  nausea, vomiting, abdominal pain, diarrhea, constipation GU: no dysuria, burning on urination, increased urinary frequency, hematuria  Ext: no leg edema Neuro: no unilateral weakness, numbness, or tingling, no vision change or hearing loss Skin: no rash MSK: No muscle spasm, no deformity, no limitation of range of movement in spin Heme: No easy bruising.  Travel history: No recent long distant travel.  Allergy: No Known Allergies  History reviewed. No pertinent past medical history.  History reviewed. No pertinent surgical history.  Social History:  reports that he has never smoked. He does not have any smokeless tobacco history on file. He reports that he does not drink alcohol or use drugs.  Family History: Reviewed with patient, all family members are healthy.   Prior to Admission medications   Medication Sig Start Date End Date Taking? Authorizing Provider  ondansetron (ZOFRAN ODT) 8 MG disintegrating tablet Take 1 tablet (8 mg total) by mouth every 8 (eight) hours as needed for nausea or vomiting. 03/15/16   Paula Libra, MD    Physical Exam: Vitals:   03/20/16 0130 03/20/16 0220 03/20/16 0308 03/20/16 0311  BP: 110/80 123/91 (!) 138/92   Pulse: 86 (!) 52  75  Resp: 13 18 (!) 26   Temp:    98.4 F (36.9 C)  TempSrc:    Oral  SpO2: 99% 100%  99%  Weight:    101.4 kg (223 lb 9.6 oz)  Height:    5\' 11"  (1.803 m)   General:  Not in acute distress HEENT:       Eyes: PERRL, EOMI, no scleral icterus.       ENT: No discharge from the ears and nose, no pharynx injection, no tonsillar enlargement.        Neck: No JVD, no bruit, no mass felt. Heme: No neck lymph node enlargement. Cardiac: S1/S2, Irregularly irregular rhythm, No murmurs, No gallops or rubs. Pulm: No rales, wheezing, rhonchi or rubs. Abd: Soft, nondistended, nontender, no rebound pain, no organomegaly, BS present. GU: No hematuria Ext: has trace leg leg edema bilaterally. 2+DP/PT pulse  bilaterally. Musculoskeletal: No joint deformities, No joint redness or warmth, no limitation of ROM in spin. Skin: No rashes.  Neuro: Alert, oriented X3, cranial nerves II-XII grossly intact, moves all extremities normally. Psych: Patient is not psychotic, no suicidal or hemocidal ideation.  Labs on Admission: I have personally reviewed following labs and imaging studies  CBC:  Recent Labs Lab 03/15/16 0540 03/19/16 2228  WBC 11.9* 11.3*  NEUTROABS 10.6* 7.7  HGB 13.7 14.1  HCT 39.9 40.8  MCV 87.3 84.8  PLT 173 217   Basic Metabolic Panel:  Recent Labs Lab 03/15/16 0540 03/19/16 2228  NA 137 132*  K 4.2 3.4*  CL 104 96*  CO2 24 23  GLUCOSE 134* 96  BUN 19 24*  CREATININE 1.77* 2.11*  CALCIUM 9.9 9.2   GFR: Estimated Creatinine Clearance: 52.5 mL/min (by C-G formula based on SCr of 2.11 mg/dL). Liver Function Tests:  Recent Labs Lab 03/15/16 0540 03/19/16 2228  AST 124* 32  ALT 98* 60  ALKPHOS 106 90  BILITOT 2.1* 1.7*  PROT 8.0 7.8  ALBUMIN 4.5 4.0    Recent Labs Lab 03/15/16 0540  LIPASE 18   No results for input(s): AMMONIA in the last 168 hours. Coagulation Profile: No results for input(s): INR, PROTIME in the last 168 hours. Cardiac Enzymes: No results for input(s): CKTOTAL, CKMB, CKMBINDEX, TROPONINI in the last 168 hours. BNP (last 3 results) No results for input(s): PROBNP in the last 8760 hours. HbA1C: No results for input(s): HGBA1C in the last 72 hours. CBG: No results for input(s): GLUCAP in the last 168 hours. Lipid Profile: No results for input(s): CHOL, HDL, LDLCALC, TRIG, CHOLHDL, LDLDIRECT in the last 72 hours. Thyroid Function Tests: No results for input(s): TSH, T4TOTAL, FREET4, T3FREE, THYROIDAB in the last 72 hours. Anemia Panel: No results for input(s): VITAMINB12, FOLATE, FERRITIN, TIBC, IRON, RETICCTPCT in the last 72 hours. Urine analysis:    Component Value Date/Time   COLORURINE YELLOW 03/15/2016 0620    APPEARANCEUR CLEAR 03/15/2016 0620   LABSPEC 1.025 03/15/2016 0620   PHURINE 6.0 03/15/2016 0620   GLUCOSEU NEGATIVE 03/15/2016 0620   HGBUR LARGE (A) 03/15/2016 0620   BILIRUBINUR NEGATIVE 03/15/2016 0620   KETONESUR 40 (A) 03/15/2016 0620   PROTEINUR 100 (A) 03/15/2016 0620   NITRITE NEGATIVE 03/15/2016 0620   LEUKOCYTESUR NEGATIVE 03/15/2016 0620   Sepsis Labs: @LABRCNTIP (procalcitonin:4,lacticidven:4) )No results found for this or any previous visit (from the past 240 hour(s)).   Radiological Exams on Admission: Dg Chest Port 1 View  Result Date: 03/19/2016 CLINICAL DATA:  45 year old male with atrial fibrillation. No chest complaints. EXAM: PORTABLE CHEST 1 VIEW COMPARISON:  None FINDINGS: Single portable view of the chest demonstrates clear lungs. There is no pleural effusion or pneumothorax. Top-normal cardiac silhouette. No acute osseous pathology. IMPRESSION: No active disease. Electronically Signed   By: Elgie Collard M.D.   On: 03/19/2016 23:26  EKG: Independently reviewed. Age of eburnation, tachycardia, QTC 514, T-wave inversion in V5-V6.  Assessment/Plan Principal Problem:   Atrial fibrillation with RVR (HCC) Active Problems:   AKI (acute kidney injury) (HCC)   New onset atrial fibrillation (HCC)   Fever, unspecified   Hypokalemia   Nausea with vomiting   Abdominal pain   New onset atrial fibrillation with RVR: CHA2DS2-VASc Score is 0. No need for long term oral anticoagulation. Heart rate is better controlled with cardizem gtt, no at ~90s. Pt does not have chest pain, shortness of breath. Etiology is not clear. Likely triggered by recent possible viral gastritis and fever.  -pt is admitted to stepdown bed for observation -continue Cardizem gtt -start coreg 3.125 mg bid -start ASA -start IV heparin for preparation for possible cardioversion -trop x 3 -2d echo -check TSH and Free t4 -check BNP -please call card in AM  Addendum: trop comes back 0.71,  still no CP -continue IV heparin and ASA  Nausea, vomiting, abdominal pain: Patient reports that his symptoms started after he ate some [place] food. Differential diagnosis include food poisoning and viral gastritis. Also symptoms have completely resolved. -IVF: 500 cc NS bolus and NS 75 cc/h.  Fever and diaphoresis: Etiology is not clear. Likely due to viral infection. Patient has mild abnormal liver function with total bilirubin 1.7, AST 32, ALT 60, need to rule out hepatitis. No symptoms of UTI or respiratory symptoms. -will check UA, hepatitis panel, HIV antibody -Chest x-ray -get blood culture -will get Procalcitonin and trend lactic acid levels  Addendum: UA has small amount of leukocyte. Patient is asymptomatic. -do urine culture.  AKI: Likely due to prerenal secondary to dehydration due to vomiting - IVF as above - Check FeNa - US-renal - Follow up renal function by BMP - Avoid ACEI and NSAIDs  Hypokalemia: K= 3.4 on admission. - Repleted with 40 mEQ of KCl - Check Mg level  DVT ppx: on IV Heparin  Code Status: Full code Family Communication: None at bed side.  Disposition Plan:  Anticipate discharge back to previous home environment Consults called:  none Admission status:  SDU/obs  Date of Service 03/20/2016    Lorretta Harp [facility] Pager [phone]  If 7PM-7AM, please contact night-coverage [email or web address] Password [facility] 03/20/2016, 4:15 AM

## 2016-03-20 NOTE — Progress Notes (Signed)
PROGRESS NOTE  Mark Ponce  KWI:[id number] DOB: 01-05-1971 DOA: 03/19/2016 PCP: Neldon Labella, MD  Brief Narrative:   Mark Ponce is a 45 y.o. male without significant medical history, who presented with fever and diaphoresis.  Pt reported that he had nausea, vomiting and abdominal pain over RUQ after eating some [place] food 4 days ago. Pt was seen in the ED 4 days ago. He had negative lipase and negative urinalysis. His nausea, vomiting and abdominal pain resolved completely, however, he developed diaphoresis/hot flashes and pains over the right lateral upper quadrant.  He was seen by his PCP and had a fever of 100 F.  He was called and advised to come to the ER when his WBC was elevated.  He had incidentally noted fast and irregular heart beat for the last few days but denies chest pains.    ED Course: pt was found to have Afib with RVR with HR up to 150s and cardzem gtt was started. WBC 11.3, temperature normal, AKI with cre 2.17. Total bilirubin 1.7, AST 32, ALT 60.    Assessment & Plan:   Principal Problem:   Atrial fibrillation with RVR (HCC) Active Problems:   AKI (acute kidney injury) (HCC)   New onset atrial fibrillation (HCC)   Fever, unspecified   Hypokalemia   Nausea with vomiting   Abdominal pain   Elevated troponin  New onset atrial fibrillation with RVR, appears to have a-flutter on telemetry today with variable conduction: CHA2DS2-VASc Score is 0. -  Heart rate is better controlled with cardizem gtt,70-80s. -  Appreciate cardiology assistance > transitioning to oral cardizem -  Heparin gtt discontinued and starting xarelto - continue coreg 3.125 mg bid - trop x 3 minimally elevated and trending down, likely demand ischemia from tachycardia - 2d echo pending  -TSH wnl and Free t4 mildly elevated > may be due to recent illness.  Repeat in 3-4 weeks   Elevated troponin, likely due to demand ischemia from tachycardia.  Nausea, vomiting, abdominal pain:  Patient reports that his symptoms started after he ate some [place] food. Differential diagnosis include food poisoning and viral gastritis. Also symptoms have completely resolved.  Fever and diaphoresis: Etiology is not clear. Likely due to viral infection. Patient has mild abnormal liver function with total bilirubin 1.7, AST 32, ALT 60, need to rule out hepatitis. No symptoms of UTI or respiratory symptoms. - UA with mildly elevated WBC -  F/u urine culture - hepatitis panel pending - HIV antibody NR -Chest x-ray:  No evidence of pneumonia -  blood culture pending  Elevated blood pressures -  Continue to trend, may have previously undiagnosed hypertension  AKI: Likely due to prerenal secondary to dehydration due to vomiting, improving with rate control and IVF - US-renal:  Medical renal, no obstruction - Follow up renal function by BMP - Avoid ACEI and NSAIDs  Hypokalemia: K= 3.4 on admission. - Repleted with 40 mEQ of KCl - Check Mg level  Possible pulmonary nodules -  Repeat CXR in 3-6 months to confirm stability  DVT ppx: on IV Heparin  Code Status: Full code Family Communication: None at bed side.  Disposition Plan:  Anticipate discharge back to previous home environment  Consultants:   Cardiology  Procedures:  ECHO  Antimicrobials:   none    Subjective: Heart rate has slowed down.  Denies nausea, vomiting, diarrhea.    Objective: Vitals:   03/20/16 0842 03/20/16 1148 03/20/16 1208 03/20/16 1308  BP:  114/68 Marland Kitchen)  117/94 (!) 132/101  Pulse:      Resp:   (!) 22 11  Temp: 98 F (36.7 C)     TempSrc:      SpO2:      Weight:      Height:        Intake/Output Summary (Last 24 hours) at 03/20/16 1559 Last data filed at 03/20/16 0700  Gross per 24 hour  Intake           313.31 ml  Output              600 ml  Net          -286.69 ml   Filed Weights   03/19/16 2215 03/20/16 0311  Weight: 99.8 kg (220 lb) 101.4 kg (223 lb 9.6 oz)     Examination:  General exam:  Adult male.  No acute distress.  HEENT:  NCAT, MMM Respiratory system: Clear to auscultation bilaterally Cardiovascular system: IRRegular rate and rhythm, normal S1/S2. No murmurs, rubs, gallops or clicks.  Warm extremities Gastrointestinal system: Normal active bowel sounds, soft, nondistended, nontender. MSK:  Normal tone and bulk, no lower extremity edema Neuro:  Grossly intact    Data Reviewed: I have personally reviewed following labs and imaging studies  CBC:  Recent Labs Lab 03/15/16 0540 03/19/16 2228 03/20/16 0434  WBC 11.9* 11.3* 11.5*  NEUTROABS 10.6* 7.7  --   HGB 13.7 14.1 14.4  HCT 39.9 40.8 42.1  MCV 87.3 84.8 86.4  PLT 173 217 230   Basic Metabolic Panel:  Recent Labs Lab 03/15/16 0540 03/19/16 2228 03/20/16 0434  NA 137 132* 134*  K 4.2 3.4* 4.0  CL 104 96* 102  CO2 24 23 19*  GLUCOSE 134* 96 86  BUN 19 24* 19  CREATININE 1.77* 2.11* 1.87*  CALCIUM 9.9 9.2 9.3  MG  --   --  2.4   GFR: Estimated Creatinine Clearance: 59.2 mL/min (by C-G formula based on SCr of 1.87 mg/dL). Liver Function Tests:  Recent Labs Lab 03/15/16 0540 03/19/16 2228  AST 124* 32  ALT 98* 60  ALKPHOS 106 90  BILITOT 2.1* 1.7*  PROT 8.0 7.8  ALBUMIN 4.5 4.0    Recent Labs Lab 03/15/16 0540  LIPASE 18   No results for input(s): AMMONIA in the last 168 hours. Coagulation Profile:  Recent Labs Lab 03/20/16 0434  INR 1.26   Cardiac Enzymes:  Recent Labs Lab 03/20/16 0434 03/20/16 0948 03/20/16 1506  TROPONINI 0.71* 0.60* 0.59*   BNP (last 3 results) No results for input(s): PROBNP in the last 8760 hours. HbA1C: No results for input(s): HGBA1C in the last 72 hours. CBG:  Recent Labs Lab 03/20/16 0742  GLUCAP 86   Lipid Profile:  Recent Labs  03/20/16 0404  CHOL 184  HDL 32*  LDLCALC 134*  TRIG 88  CHOLHDL 5.8   Thyroid Function Tests:  Recent Labs  03/20/16 0434 03/20/16 0435  TSH  --  1.688   FREET4 1.62*  --    Anemia Panel: No results for input(s): VITAMINB12, FOLATE, FERRITIN, TIBC, IRON, RETICCTPCT in the last 72 hours. Urine analysis:    Component Value Date/Time   COLORURINE YELLOW 03/20/2016 0446   APPEARANCEUR CLOUDY (A) 03/20/2016 0446   LABSPEC 1.013 03/20/2016 0446   PHURINE 5.5 03/20/2016 0446   GLUCOSEU NEGATIVE 03/20/2016 0446   HGBUR LARGE (A) 03/20/2016 0446   BILIRUBINUR NEGATIVE 03/20/2016 0446   KETONESUR 15 (A) 03/20/2016 0446   PROTEINUR 30 (  A) 03/20/2016 0446   NITRITE NEGATIVE 03/20/2016 0446   LEUKOCYTESUR SMALL (A) 03/20/2016 0446   Sepsis Labs: @LABRCNTIP (procalcitonin:4,lacticidven:4)  ) Recent Results (from the past 240 hour(s))  MRSA PCR Screening     Status: None   Collection Time: 03/20/16  3:10 AM  Result Value Ref Range Status   MRSA by PCR NEGATIVE NEGATIVE Final    Comment:        The GeneXpert MRSA Assay (FDA approved for NASAL specimens only), is one component of a comprehensive MRSA colonization surveillance program. It is not intended to diagnose MRSA infection nor to guide or monitor treatment for MRSA infections.       Radiology Studies: Dg Chest 2 View  Result Date: 03/20/2016 CLINICAL DATA:  Fever. EXAM: CHEST  2 VIEW COMPARISON:  03/19/2016. FINDINGS: Mediastinum hilar structures normal. Stable cardiomegaly. Mild left base subsegmental atelectasis. Questionable pulmonary nodules in the apices bilaterally, most likely overlying structures. Follow-up PA and lateral chest x-ray is suggested to demonstrate resolution . No pleural effusion or pneumothorax. No acute bony abnormality. IMPRESSION: 1. Cardiomegaly.  No pulmonary venous congestion. 2. Questionable pulmonary nodules noted projected over the apices, most likely overlapping structures. Follow-up PA and lateral chest x-ray suggested. Mild left base subsegmental atelectasis. No focal pulmonary infiltrate. Electronically Signed   By: Maisie Fus  Register   On:  03/20/2016 06:54  US Renal  Result Date: 03/20/2016 CLINICAL DATA:  44 year old male with increased BUN and creatinine. Acute renal insufficiency. EXAM: RENAL / URINARY TRACT ULTRASOUND COMPLETE COMPARISON:  None. FINDINGS: Evaluation is limited due to portable technique. Right Kidney: Length: 12 cm. There is diffuse increased echotexture. No hydronephrosis or echogenic stone. Left Kidney: Length: 13 cm. There is a large area of defect and indentation of the upper pole parenchyma may represent scarring. There is diffuse increased echotexture. No hydronephrosis or echogenic stone. Bladder: Appears normal for degree of bladder distention. IMPRESSION: No hydronephrosis or echogenic stone. Increased renal echotexture, likely related to underlying medical renal disease. Clinical correlation is recommended. Electronically Signed   By: Elgie Collard M.D.   On: 03/20/2016 05:17  Dg Chest Port 1 View  Result Date: 03/19/2016 CLINICAL DATA:  45 year old male with atrial fibrillation. No chest complaints. EXAM: PORTABLE CHEST 1 VIEW COMPARISON:  None FINDINGS: Single portable view of the chest demonstrates clear lungs. There is no pleural effusion or pneumothorax. Top-normal cardiac silhouette. No acute osseous pathology. IMPRESSION: No active disease. Electronically Signed   By: Elgie Collard M.D.   On: 03/19/2016 23:26    Scheduled Meds: . aspirin  325 mg Oral Daily  . carvedilol  3.125 mg Oral BID WC  . diltiazem  240 mg Oral Daily  . rivaroxaban  20 mg Oral Q supper  . sodium chloride flush  3 mL Intravenous Q12H   Continuous Infusions:    LOS: 0 days    Time spent: 30 min    Renae Fickle, MD [facility] Pager [phone]  If 7PM-7AM, please contact night-coverage [email or web address] Password [facility] 03/20/2016, 3:59 PM

## 2016-03-20 NOTE — Progress Notes (Signed)
ANTICOAGULATION CONSULT NOTE - Initial Consult  Pharmacy Consult for heparin Indication: atrial fibrillation  No Known Allergies  Patient Measurements: Height: 5\' 11"  (180.3 cm) Weight: 223 lb 9.6 oz (101.4 kg) IBW/kg (Calculated) : 75.3 Heparin Dosing Weight: 95kg  Vital Signs: Temp: 98.4 F (36.9 C) (07/27 0311) Temp Source: Oral (07/27 0311) BP: 138/92 (07/27 0308) Pulse Rate: 75 (07/27 0311)  Labs:  Recent Labs  03/19/16 2228  HGB 14.1  HCT 40.8  PLT 217  CREATININE 2.11*    Estimated Creatinine Clearance: 52.5 mL/min (by C-G formula based on SCr of 2.11 mg/dL).   Medical History: History reviewed. No pertinent past medical history.  Medications:  Prescriptions Prior to Admission  Medication Sig Dispense Refill Last Dose  . ondansetron (ZOFRAN ODT) 8 MG disintegrating tablet Take 1 tablet (8 mg total) by mouth every 8 (eight) hours as needed for nausea or vomiting. 10 tablet 0    Scheduled:  . aspirin  325 mg Oral Daily  . carvedilol  3.125 mg Oral BID WC  . potassium chloride  40 mEq Oral Once  . sodium chloride flush  3 mL Intravenous Q12H   Infusions:  . diltiazem (CARDIZEM) infusion 5 mg/hr (03/20/16 0311)    Assessment: 45yo male was sent to ED by PCP for elevated WBC, Tmax 100 PTA that has since resolved, found to be in Afib w/ RVR (pt notes palpitations recently upon prompting but cannot pinpoint when they started), to begin heparin.  Goal of Therapy:  Heparin level 0.3-0.7 units/ml Monitor platelets by anticoagulation protocol: Yes   Plan:  Will give heparin 4000 units x1 followed by gtt at 1400 units/hr and monitor heparin levels and CBC.  Vernard Gambles, PharmD, BCPS  03/20/2016,3:54 AM

## 2016-03-20 NOTE — Progress Notes (Signed)
Notified Donnamarie Poag NP about troponin level 0.71. Patient has no complaints of chest pain.

## 2016-03-20 NOTE — Plan of Care (Signed)
Problem: Education: Goal: Knowledge of disease or condition will improve Outcome: Completed/Met Date Met: 03/20/16 Pt received information about a-fib and is being educated throughout entire hospitalization regarding tests, procedures,  And medications

## 2016-03-20 NOTE — Progress Notes (Signed)
  Echocardiogram 2D Echocardiogram has been performed.  Arvil Chaco 03/20/2016, 3:55 PM

## 2016-03-21 ENCOUNTER — Other Ambulatory Visit: Payer: Self-pay | Admitting: Physician Assistant

## 2016-03-21 DIAGNOSIS — R7989 Other specified abnormal findings of blood chemistry: Secondary | ICD-10-CM | POA: Diagnosis not present

## 2016-03-21 DIAGNOSIS — N179 Acute kidney failure, unspecified: Secondary | ICD-10-CM | POA: Diagnosis not present

## 2016-03-21 DIAGNOSIS — I4891 Unspecified atrial fibrillation: Secondary | ICD-10-CM | POA: Diagnosis not present

## 2016-03-21 DIAGNOSIS — R778 Other specified abnormalities of plasma proteins: Secondary | ICD-10-CM

## 2016-03-21 LAB — CBC
HEMATOCRIT: 37.6 % — AB (ref 39.0–52.0)
HEMOGLOBIN: 12.7 g/dL — AB (ref 13.0–17.0)
MCH: 29.2 pg (ref 26.0–34.0)
MCHC: 33.8 g/dL (ref 30.0–36.0)
MCV: 86.4 fL (ref 78.0–100.0)
Platelets: 226 10*3/uL (ref 150–400)
RBC: 4.35 MIL/uL (ref 4.22–5.81)
RDW: 13.5 % (ref 11.5–15.5)
WBC: 10.4 10*3/uL (ref 4.0–10.5)

## 2016-03-21 LAB — HEPATITIS PANEL, ACUTE
HCV Ab: 0.2 s/co ratio (ref 0.0–0.9)
HEP B S AG: NEGATIVE
Hep A IgM: NEGATIVE
Hep B C IgM: NEGATIVE

## 2016-03-21 LAB — URINE CULTURE: CULTURE: NO GROWTH

## 2016-03-21 LAB — HEMOGLOBIN A1C
Hgb A1c MFr Bld: 5.4 % (ref 4.8–5.6)
Mean Plasma Glucose: 108 mg/dL

## 2016-03-21 LAB — BASIC METABOLIC PANEL
Anion gap: 11 (ref 5–15)
BUN: 13 mg/dL (ref 6–20)
CALCIUM: 9 mg/dL (ref 8.9–10.3)
CO2: 24 mmol/L (ref 22–32)
CREATININE: 1.68 mg/dL — AB (ref 0.61–1.24)
Chloride: 101 mmol/L (ref 101–111)
GFR, EST AFRICAN AMERICAN: 54 mL/min — AB (ref >60)
GFR, EST NON AFRICAN AMERICAN: 47 mL/min — AB (ref >60)
Glucose, Bld: 90 mg/dL (ref 65–99)
Potassium: 4.2 mmol/L (ref 3.5–5.1)
SODIUM: 136 mmol/L (ref 135–145)

## 2016-03-21 LAB — GLUCOSE, CAPILLARY: GLUCOSE-CAPILLARY: 75 mg/dL (ref 65–99)

## 2016-03-21 MED ORDER — RIVAROXABAN 20 MG PO TABS: 20.0000 mg | ORAL_TABLET | Freq: Every day | ORAL | 0 refills | Status: DC

## 2016-03-21 MED ORDER — DILTIAZEM HCL ER COATED BEADS 240 MG PO CP24: 240.0000 mg | ORAL_CAPSULE | Freq: Every day | ORAL | 0 refills | Status: DC

## 2016-03-21 MED ORDER — CARVEDILOL 3.125 MG PO TABS: 3.1250 mg | ORAL_TABLET | Freq: Two times a day (BID) | ORAL | 0 refills | Status: DC

## 2016-03-21 NOTE — Plan of Care (Signed)
Problem: Education: Goal: Understanding of medication regimen will improve Outcome: Completed/Met Date Met: 03/21/16 Pt stated understanding about a-fib. Pt went home on xarelto. Pt stated understanding of the medications as well as his heart rhythm.

## 2016-03-21 NOTE — Discharge Instructions (Signed)

## 2016-03-21 NOTE — Care Management Note (Addendum)
Case Management Note  Patient Details  Name: Mark Ponce MRN: [id number] Date of Birth: July 18, 1971  Subjective/Objective:  Pt presented for AFib RVR- Plan will be to d/c home on the medication Xarelto. Pt has insurance.                   Action/Plan: CM will provide pt with 30 day free Xarelto card and a zero co pay card. No further needs from CM at this time.    Expected Discharge Date:                  Expected Discharge Plan:  Home/Self Care  In-House Referral:  NA  Discharge planning Services  CM Consult, Medication Assistance  Post Acute Care Choice:  NA Choice offered to:  NA  DME Arranged:  N/A DME Agency:  NA  HH Arranged:  NA HH Agency:  NA  Status of Service:  Completed, signed off  If discussed at Long Length of Stay Meetings, dates discussed:    Additional Comments:XARELTO 20 MG PO DAILY (30 ) 30 TAB   COVER- YES  CO-PAY- $ 82.92  TIER- 2 DRUG  PRIOR APPROVAL- NO  PHARMACY : [facility]   Gala Lewandowsky, RN 03/21/2016, 10:16 AM

## 2016-03-21 NOTE — Progress Notes (Signed)
Patient Name: Mark Ponce Date of Encounter: 03/21/2016  Principal Problem:   Atrial fibrillation with RVR (HCC) Active Problems:   AKI (acute kidney injury) (HCC)   New onset atrial fibrillation (HCC)   Fever, unspecified   Hypokalemia   Nausea with vomiting   Abdominal pain   Elevated troponin   Length of Stay: 0  SUBJECTIVE  Feels well. Unaware of arrhythmia.  CURRENT MEDS . carvedilol  3.125 mg Oral BID WC  . diltiazem  240 mg Oral Daily  . rivaroxaban  20 mg Oral Q supper  . sodium chloride flush  3 mL Intravenous Q12H    OBJECTIVE   Intake/Output Summary (Last 24 hours) at 03/21/16 0913 Last data filed at 03/21/16 0700  Gross per 24 hour  Intake                0 ml  Output             1550 ml  Net            -1550 ml   Filed Weights   03/19/16 2215 03/20/16 0311 03/21/16 0418  Weight: 99.8 kg (220 lb) 101.4 kg (223 lb 9.6 oz) 100.3 kg (221 lb 3.2 oz)    PHYSICAL EXAM Vitals:   03/21/16 0750 03/21/16 0752 03/21/16 0756 03/21/16 0854  BP:  120/89    Pulse:   70 87  Resp: (!) 23 17    Temp:    98.4 F (36.9 C)  TempSrc:    Oral  SpO2:    100%  Weight:      Height:       General: Alert, oriented x3, no distress Head: no evidence of trauma, PERRL, EOMI, no exophtalmos or lid lag, no myxedema, no xanthelasma; normal ears, nose and oropharynx Neck: normal jugular venous pulsations and no hepatojugular reflux; brisk carotid pulses without delay and no carotid bruits Chest: clear to auscultation, no signs of consolidation by percussion or palpation, normal fremitus, symmetrical and full respiratory excursions Cardiovascular: normal position and quality of the apical impulse, irregular rhythm, normal first and second heart sounds, no rubs or gallops, no murmur Abdomen: no tenderness or distention, no masses by palpation, no abnormal pulsatility or arterial bruits, normal bowel sounds, no hepatosplenomegaly Extremities: no clubbing, cyanosis or edema; 2+  radial, ulnar and brachial pulses bilaterally; 2+ right femoral, posterior tibial and dorsalis pedis pulses; 2+ left femoral, posterior tibial and dorsalis pedis pulses; no subclavian or femoral bruits Neurological: grossly nonfocal  LABS  CBC  Recent Labs  03/19/16 2228 03/20/16 0434 03/21/16 0413  WBC 11.3* 11.5* 10.4  NEUTROABS 7.7  --   --   HGB 14.1 14.4 12.7*  HCT 40.8 42.1 37.6*  MCV 84.8 86.4 86.4  PLT 217 230 226   Basic Metabolic Panel  Recent Labs  03/20/16 0434 03/21/16 0413  NA 134* 136  K 4.0 4.2  CL 102 101  CO2 19* 24  GLUCOSE 86 90  BUN 19 13  CREATININE 1.87* 1.68*  CALCIUM 9.3 9.0  MG 2.4  --    Liver Function Tests  Recent Labs  03/19/16 2228  AST 32  ALT 60  ALKPHOS 90  BILITOT 1.7*  PROT 7.8  ALBUMIN 4.0   No results for input(s): LIPASE, AMYLASE in the last 72 hours. Cardiac Enzymes  Recent Labs  03/20/16 0434 03/20/16 0948 03/20/16 1506  TROPONINI 0.71* 0.60* 0.59*   BNP Invalid input(s): POCBNP D-Dimer No results for input(s): DDIMER in  the last 72 hours. Hemoglobin A1C  Recent Labs  03/20/16 0441  HGBA1C 5.4   Fasting Lipid Panel  Recent Labs  03/20/16 0404  CHOL 184  HDL 32*  LDLCALC 134*  TRIG 88  CHOLHDL 5.8   Thyroid Function Tests  Recent Labs  03/20/16 0435  TSH 1.688    Radiology Studies Imaging results have been reviewed and Dg Chest 2 View  Result Date: 03/20/2016 CLINICAL DATA:  Fever. EXAM: CHEST  2 VIEW COMPARISON:  03/19/2016. FINDINGS: Mediastinum hilar structures normal. Stable cardiomegaly. Mild left base subsegmental atelectasis. Questionable pulmonary nodules in the apices bilaterally, most likely overlying structures. Follow-up PA and lateral chest x-ray is suggested to demonstrate resolution . No pleural effusion or pneumothorax. No acute bony abnormality. IMPRESSION: 1. Cardiomegaly.  No pulmonary venous congestion. 2. Questionable pulmonary nodules noted projected over the apices,  most likely overlapping structures. Follow-up PA and lateral chest x-ray suggested. Mild left base subsegmental atelectasis. No focal pulmonary infiltrate. Electronically Signed   By: Maisie Fus  Register   On: 03/20/2016 06:54  US Renal  Result Date: 03/20/2016 CLINICAL DATA:  45 year old male with increased BUN and creatinine. Acute renal insufficiency. EXAM: RENAL / URINARY TRACT ULTRASOUND COMPLETE COMPARISON:  None. FINDINGS: Evaluation is limited due to portable technique. Right Kidney: Length: 12 cm. There is diffuse increased echotexture. No hydronephrosis or echogenic stone. Left Kidney: Length: 13 cm. There is a large area of defect and indentation of the upper pole parenchyma may represent scarring. There is diffuse increased echotexture. No hydronephrosis or echogenic stone. Bladder: Appears normal for degree of bladder distention. IMPRESSION: No hydronephrosis or echogenic stone. Increased renal echotexture, likely related to underlying medical renal disease. Clinical correlation is recommended. Electronically Signed   By: Elgie Collard M.D.   On: 03/20/2016 05:17  Dg Chest Port 1 View  Result Date: 03/19/2016 CLINICAL DATA:  45 year old male with atrial fibrillation. No chest complaints. EXAM: PORTABLE CHEST 1 VIEW COMPARISON:  None FINDINGS: Single portable view of the chest demonstrates clear lungs. There is no pleural effusion or pneumothorax. Top-normal cardiac silhouette. No acute osseous pathology. IMPRESSION: No active disease. Electronically Signed   By: Elgie Collard M.D.   On: 03/19/2016 23:26   ECHO Normal LVEF, mild LVH, mildly dilated left atrium, no significant valvular abnormalities.  TELE atrial fibrillation with controlled rate. Night-time 2" pauses are not significant    ASSESSMENT AND PLAN  Unable to schedule for TEE/CV today. Will plan for TEE/CV Monday. Outpatient treadmill Myoview and then follow up with me. DC on Eliquis and diltiazem. Seems to have HTN  which is probably cause of echo changes and may be cause of renal dysfunction. However, he has a fairly active urinary sediment and renal tubular epithelial cells are present. Consider evaluation for intrinsic renal disease (could he have had ATN after his GI illness? Glomerulonephritis as cause of elevated BP and renal dysfunction?).   Thurmon Fair, MD, [facility] [phone] office [phone] pager 03/21/2016 9:13 AM

## 2016-03-21 NOTE — Plan of Care (Signed)
Problem: Health Behavior/Discharge Planning: Goal: Ability to safely manage health-related needs after discharge will improve Outcome: Completed/Met Date Met: 03/21/16 Case manager met with pt. Pt given xarelto card. Pt was discharged on xarelto as well as coreg. Education given on all discharge medications.

## 2016-03-21 NOTE — Discharge Summary (Signed)
Physician Discharge Summary  Mark Ponce PJK:[id number] DOB: [birth year] DOA: 03/19/2016  PCP: Tawanna Solo, MD  Admit date: 03/19/2016 Discharge date: 03/21/2016  Admitted From: home  Disposition:  home  Recommendations for Outpatient Follow-up:  1. Follow up with cardiology for TEE/DCCV on Monday 7/31 2. Please obtain BMP, urinalysis and urine protein:creatinine ratio in 1-2 weeks 3. Follow up with cardiology in 1-2 weeks for outpatient stress test, paperwork for ongoing xarelto therapy if needed for insurance purposed.   4. PCP:  CXR PA/lat in 3-6 months to follow up pulmonary nodules 5. PCP:  Repeat TFTs in 3-4 weeks 6. PCP:  Referral to nephrology if creatinine remains elevated and proteinuria/hemoglobinuria persist  Home Health:  none  Equipment/Devices:  none   Discharge Condition:  Stable, improved CODE STATUS:  full  Diet recommendation:  Healthy heart   Brief/Interim Summary:  Mark Ponce a 45 y.o.malewithout significantmedical history, who presented with fever and diaphoresis.  Pt reported that he had nausea, vomiting and abdominal pain over RUQ after eating some [place] food 4 days ago. Pt was seen in the ED 4 days ago. He had negative lipase and negative urinalysis. His nausea, vomiting and abdominal pain resolved completely, however, he developed diaphoresis/hot flashes and pains over the right lateral upper quadrant.  He was seen by his PCP and had a fever of 100 F.  He was called and advised to come to the ER when his WBC was elevated.  He had incidentally noted fast and irregular heart beat for the last few days but denies chest pains.    ED Course:pt was found to have Afib with RVR with HR up to 150s and cardzem gtt was started. WBC 11.3, temperature normal, AKI with cre 2.17. Total bilirubin 1.7, AST 32, ALT 60.    Discharge Diagnoses:  Principal Problem:   Atrial fibrillation with RVR ([place]) Active Problems:   AKI (acute kidney injury) ([place])    New onset atrial fibrillation ([facility])   Fever, unspecified   Hypokalemia   Nausea with vomiting   Abdominal pain   Elevated troponin  New onset atrial fibrillation with RVR with intermittent a-flutter with variable conduction likely caused by his recent severe gastroenteritis.  CHA2DS2-VASc Scoreis likely 1 due to suspected hypertension.  He was started on low dose oral beta blocker and cardizem gtt which brought his heart rate from the 140s to the 70-80s.  He was transitioned to oral cardizem on 7/27 with ongoing excellent rate control.  He was heparinized initially due to concern he may have inpatient DCCV, however, he was scheduled for an outpatient procedure on 7/31 and was transitioned to xarelto.  He met with the case manager who provided him with a 30-day free xarelto card.  His troponins were mildly elevated and trended down, likely due to stress from tachycardia as opposed to primary ACS.  His ECHO demonstrated preserved EF with no significant valvular abnormalities and no regional wall motion abnormalities.  He will follow up for an outpatient stress test in a week or two with cardiology.  TSH was wnl and free t4 mildly elevated.  I doubt this is the cause of the patient's tachycardia and is unreliable in the setting of recent illness.  Repeat TFTs in 3-4 weeks.    Elevated troponin, likely due to demand ischemia from tachycardia.  Outpatient stress test.  Nausea, vomiting, abdominal pain: Patient reports that his symptoms started after he atesome [place] food. Differential diagnosis include food poisoning and viral gastritis.  Symptoms completely resolved at time of admission.  Fever and diaphoresis, likely due to recent viral gastroenteritis. Patient had mild abnormal liver function with total bilirubin 1.7, AST 32, ALT 60. No symptoms of UTI or respiratory symptoms.  UA with mildly elevated WBC but urine culture was no growth final.  HIV was negative.  Acute hepatitis panel was negative  for hepatitis A, B, and C.  CXR demonstrated no evidence of pneumonia.  Blood cultures remained negative.   Elevated blood pressures, may have had previously undiagnosed hypertension as blood pressures remained stable even on cardizem infusion and beta blocker.    AKI with possible underlying CKD.  Likely due to prerenal and possible mild ATN and rhabdomyolysis secondary to dehydration from vomiting.  FENa suggested prerenal etiology.  His Creatinine trended down from 2.1 to 1.6 with IVF, but did not return to normal.  Renal US suggested medical renal disease but no obstruction.  Advised him to avoid NSAIDs and ACEI.  He had some mild proteinuria, but did not have underlying diabetes.  No globulin gap to suggest multiple myeloma.  Testing for HIV and hepatitis were negative.  Proteinuria could be the result of recent fevers.  Hemoglobin positive but no RBC detected suggested rhabdo at time of viral illness.  Recommended that he return to his PCP in a few weeks for repeat BMP and UA with urine P:C and referral to nephrology, particularly if hemoglobinuria persists, to exclude glomerulonephritis.   Hypokalemia: K= 3.4 on admission.  Resolved with oral potassium supplementation.    Possible pulmonary nodules Repeat CXR in 3-6 months to confirm stability.  Doubt Wegener's, but possible given possible pulmonary nodules and possible CKD.    Discharge Instructions  Discharge Instructions    Call MD for:  difficulty breathing, headache or visual disturbances    Complete by:  As directed   Call MD for:  extreme fatigue    Complete by:  As directed   Call MD for:  hives    Complete by:  As directed   Call MD for:  persistant dizziness or light-headedness    Complete by:  As directed   Call MD for:  persistant nausea and vomiting    Complete by:  As directed   Call MD for:  severe uncontrolled pain    Complete by:  As directed   Call MD for:  temperature >100.4    Complete by:  As directed   Diet - low  sodium heart healthy    Complete by:  As directed   Discharge instructions    Complete by:  As directed   If you have lightheadedness, fast heart beat, racing heart, fainting spells, severe chest pain with shortness of breath, please return to the emergency department right away or call 911.  If you have bleeding that does not stop, please return to the emergency department or call 911.   Increase activity slowly    Complete by:  As directed       Medication List    TAKE these medications   acetaminophen 325 MG tablet Commonly known as:  TYLENOL Take 650 mg by mouth every 6 (six) hours as needed.   carvedilol 3.125 MG tablet Commonly known as:  COREG Take 1 tablet (3.125 mg total) by mouth 2 (two) times daily with a meal.   diltiazem 240 MG 24 hr capsule Commonly known as:  CARDIZEM CD Take 1 capsule (240 mg total) by mouth daily.   ondansetron 8 MG disintegrating tablet  Commonly known as:  ZOFRAN ODT Take 1 tablet (8 mg total) by mouth every 8 (eight) hours as needed for nausea or vomiting.   rivaroxaban 20 MG Tabs tablet Commonly known as:  XARELTO Take 1 tablet (20 mg total) by mouth daily with supper.      Follow-up Information    Mihai Croitoru, MD. Go on 03/31/2016.   Specialty:  Cardiology Why:  Stress test and follow-up appointment, Stress test @ 7:45 a.m.        Follow-up: August 14th @ 12:45 p.m. Contact information: [address] [place] [address] [phone]          No Known Allergies  Consultations: Cardiology, Dr. Croitoru   Procedures/Studies: Dg Chest 2 View  Result Date: 03/20/2016 CLINICAL DATA:  Fever. EXAM: CHEST  2 VIEW COMPARISON:  03/19/2016. FINDINGS: Mediastinum hilar structures normal. Stable cardiomegaly. Mild left base subsegmental atelectasis. Questionable pulmonary nodules in the apices bilaterally, most likely overlying structures. Follow-up PA and lateral chest x-ray is suggested to demonstrate resolution . No  pleural effusion or pneumothorax. No acute bony abnormality. IMPRESSION: 1. Cardiomegaly.  No pulmonary venous congestion. 2. Questionable pulmonary nodules noted projected over the apices, most likely overlapping structures. Follow-up PA and lateral chest x-ray suggested. Mild left base subsegmental atelectasis. No focal pulmonary infiltrate. Electronically Signed   By: Thomas  Register   On: 03/20/2016 06:54  Us Renal  Result Date: 03/20/2016 CLINICAL DATA:  45-year-old male with increased BUN and creatinine. Acute renal insufficiency. EXAM: RENAL / URINARY TRACT ULTRASOUND COMPLETE COMPARISON:  None. FINDINGS: Evaluation is limited due to portable technique. Right Kidney: Length: 12 cm. There is diffuse increased echotexture. No hydronephrosis or echogenic stone. Left Kidney: Length: 13 cm. There is a large area of defect and indentation of the upper pole parenchyma may represent scarring. There is diffuse increased echotexture. No hydronephrosis or echogenic stone. Bladder: Appears normal for degree of bladder distention. IMPRESSION: No hydronephrosis or echogenic stone. Increased renal echotexture, likely related to underlying medical renal disease. Clinical correlation is recommended. Electronically Signed   By: Arash  Radparvar M.D.   On: 03/20/2016 05:17  Dg Chest Port 1 View  Result Date: 03/19/2016 CLINICAL DATA:  45-year-old male with atrial fibrillation. No chest complaints. EXAM: PORTABLE CHEST 1 VIEW COMPARISON:  None FINDINGS: Single portable view of the chest demonstrates clear lungs. There is no pleural effusion or pneumothorax. Top-normal cardiac silhouette. No acute osseous pathology. IMPRESSION: No active disease. Electronically Signed   By: Arash  Radparvar M.D.   On: 03/19/2016 23:26   ECHO:  Left ventricle: The cavity size was normal. Wall thickness was   increased in a pattern of mild LVH. Systolic function was normal.   The estimated ejection fraction was in the range of 55% to  60%.   Wall motion was normal; there were no regional wall motion   abnormalities. The study is not technically sufficient to allow   evaluation of LV diastolic function. - Aorta: Aortic root dimension: 39 mm (ED). - Ascending aorta: The ascending aorta was mildly dilated. - Mitral valve: There was mild regurgitation. - Left atrium: The atrium was mildly dilated. Volume/bsa, ES,   (1-plane Simpson&'s, A2C): 36.2 ml/m^2. - Right atrium: The atrium was mildly dilated.  Subjective: Denies chest pains, SOB, nausea, vomiting, diarrhea.  Educated about the importance of compliance with his blood thinner.    Discharge Exam: Vitals:   03/21/16 0854 03/21/16 1208  BP:  (!) 118/95  Pulse: 87 77    Resp:    Temp: 98.4 F (36.9 C) 98.4 F (36.9 C)   Vitals:   03/21/16 0752 03/21/16 0756 03/21/16 0854 03/21/16 1208  BP: 120/89   (!) 118/95  Pulse:  70 87 77  Resp: 17     Temp:   98.4 F (36.9 C) 98.4 F (36.9 C)  TempSrc:   Oral Oral  SpO2:   100% 100%  Weight:      Height:        General exam:  Adult male.  No acute distress.  HEENT:  NCAT, MMM Respiratory system: Clear to auscultation bilaterally Cardiovascular system: IRRegular rate and rhythm, normal S1/S2. No murmurs, rubs, gallops or clicks.  Warm extremities Gastrointestinal system: Normal active bowel sounds, soft, nondistended, nontender. MSK:  Normal tone and bulk, no lower extremity edema Neuro:  Grossly intact   The results of significant diagnostics from this hospitalization (including imaging, microbiology, ancillary and laboratory) are listed below for reference.     Microbiology: Recent Results (from the past 240 hour(s))  MRSA PCR Screening     Status: None   Collection Time: 03/20/16  3:10 AM  Result Value Ref Range Status   MRSA by PCR NEGATIVE NEGATIVE Final    Comment:        The GeneXpert MRSA Assay (FDA approved for NASAL specimens only), is one component of a comprehensive MRSA  colonization surveillance program. It is not intended to diagnose MRSA infection nor to guide or monitor treatment for MRSA infections.   Urine culture     Status: None   Collection Time: 03/20/16  5:51 AM  Result Value Ref Range Status   Specimen Description URINE, RANDOM  Final   Special Requests NONE  Final   Culture NO GROWTH  Final   Report Status 03/21/2016 FINAL  Final     Labs: BNP (last 3 results)  Recent Labs  03/20/16 0435  BNP 563.8*   Basic Metabolic Panel:  Recent Labs Lab 03/15/16 0540 03/19/16 2228 03/20/16 0434 03/21/16 0413  NA 137 132* 134* 136  K 4.2 3.4* 4.0 4.2  CL 104 96* 102 101  CO2 24 23 19* 24  GLUCOSE 134* 96 86 90  BUN 19 24* 19 13  CREATININE 1.77* 2.11* 1.87* 1.68*  CALCIUM 9.9 9.2 9.3 9.0  MG  --   --  2.4  --    Liver Function Tests:  Recent Labs Lab 03/15/16 0540 03/19/16 2228  AST 124* 32  ALT 98* 60  ALKPHOS 106 90  BILITOT 2.1* 1.7*  PROT 8.0 7.8  ALBUMIN 4.5 4.0    Recent Labs Lab 03/15/16 0540  LIPASE 18   No results for input(s): AMMONIA in the last 168 hours. CBC:  Recent Labs Lab 03/15/16 0540 03/19/16 2228 03/20/16 0434 03/21/16 0413  WBC 11.9* 11.3* 11.5* 10.4  NEUTROABS 10.6* 7.7  --   --   HGB 13.7 14.1 14.4 12.7*  HCT 39.9 40.8 42.1 37.6*  MCV 87.3 84.8 86.4 86.4  PLT 173 217 230 226   Cardiac Enzymes:  Recent Labs Lab 03/20/16 0434 03/20/16 0948 03/20/16 1506  TROPONINI 0.71* 0.60* 0.59*   BNP: Invalid input(s): POCBNP CBG:  Recent Labs Lab 03/20/16 0742 03/21/16 0733  GLUCAP 86 75   D-Dimer No results for input(s): DDIMER in the last 72 hours. Hgb A1c  Recent Labs  03/20/16 0441  HGBA1C 5.4   Lipid Profile  Recent Labs  03/20/16 0404  CHOL 184  HDL 32*  LDLCALC  134*  TRIG 88  CHOLHDL 5.8   Thyroid function studies  Recent Labs  03/20/16 0435  TSH 1.688   Anemia work up No results for input(s): VITAMINB12, FOLATE, FERRITIN, TIBC, IRON, RETICCTPCT  in the last 72 hours. Urinalysis    Component Value Date/Time   COLORURINE YELLOW 03/20/2016 0446   APPEARANCEUR CLOUDY (A) 03/20/2016 0446   LABSPEC 1.013 03/20/2016 0446   PHURINE 5.5 03/20/2016 0446   GLUCOSEU NEGATIVE 03/20/2016 0446   HGBUR LARGE (A) 03/20/2016 0446   BILIRUBINUR NEGATIVE 03/20/2016 0446   KETONESUR 15 (A) 03/20/2016 0446   PROTEINUR 30 (A) 03/20/2016 0446   NITRITE NEGATIVE 03/20/2016 0446   LEUKOCYTESUR SMALL (A) 03/20/2016 0446   Sepsis Labs Invalid input(s): PROCALCITONIN,  WBC,  LACTICIDVEN   Time coordinating discharge: Over 30 minutes  SIGNED:   Janece Canterbury, MD  [facility] 03/21/2016, 1:56 PM Pager   If 7PM-7AM, please contact night-coverage [email or web address] Password [email or web address]

## 2016-03-24 ENCOUNTER — Ambulatory Visit (HOSPITAL_COMMUNITY)
Admission: RE | Admit: 2016-03-24 | Discharge: 2016-03-24 | Disposition: A | Discharge status: Home / Self Care | Payer: BLUE CROSS/BLUE SHIELD | Source: Ambulatory Visit | Attending: Cardiovascular Disease | Admitting: Cardiovascular Disease

## 2016-03-24 ENCOUNTER — Ambulatory Visit (HOSPITAL_BASED_OUTPATIENT_CLINIC_OR_DEPARTMENT_OTHER): Payer: BLUE CROSS/BLUE SHIELD

## 2016-03-24 ENCOUNTER — Encounter (HOSPITAL_COMMUNITY)
Admission: RE | Disposition: A | Discharge status: Home / Self Care | Payer: Self-pay | Source: Ambulatory Visit | Attending: Cardiovascular Disease

## 2016-03-24 ENCOUNTER — Ambulatory Visit (HOSPITAL_COMMUNITY): Payer: BLUE CROSS/BLUE SHIELD | Admitting: Certified Registered Nurse Anesthetist

## 2016-03-24 ENCOUNTER — Encounter (HOSPITAL_COMMUNITY): Payer: Self-pay | Admitting: *Deleted

## 2016-03-24 DIAGNOSIS — I10 Essential (primary) hypertension: Secondary | ICD-10-CM | POA: Diagnosis not present

## 2016-03-24 DIAGNOSIS — I481 Persistent atrial fibrillation: Secondary | ICD-10-CM

## 2016-03-24 DIAGNOSIS — I4891 Unspecified atrial fibrillation: Secondary | ICD-10-CM | POA: Diagnosis not present

## 2016-03-24 DIAGNOSIS — N189 Chronic kidney disease, unspecified: Secondary | ICD-10-CM | POA: Diagnosis not present

## 2016-03-24 DIAGNOSIS — I2510 Atherosclerotic heart disease of native coronary artery without angina pectoris: Secondary | ICD-10-CM | POA: Insufficient documentation

## 2016-03-24 DIAGNOSIS — I4819 Other persistent atrial fibrillation: Secondary | ICD-10-CM

## 2016-03-24 HISTORY — PX: TEE WITHOUT CARDIOVERSION: SHX5443

## 2016-03-24 HISTORY — PX: CARDIOVERSION: SHX1299

## 2016-03-24 SURGERY — ECHOCARDIOGRAM, TRANSESOPHAGEAL
Anesthesia: Monitor Anesthesia Care

## 2016-03-24 MED ORDER — SODIUM CHLORIDE 0.9 % IV SOLN: INTRAVENOUS | Status: DC

## 2016-03-24 MED ORDER — BUTAMBEN-TETRACAINE-BENZOCAINE 2-2-14 % EX AERO
INHALATION_SPRAY | CUTANEOUS | Status: DC | PRN
  Administered 2016-03-24: 2 via TOPICAL

## 2016-03-24 MED ORDER — LACTATED RINGERS IV SOLN
INTRAVENOUS | Status: DC
  Administered 2016-03-24: 1000 mL via INTRAVENOUS

## 2016-03-24 MED ORDER — PROPOFOL 500 MG/50ML IV EMUL
INTRAVENOUS | Status: DC | PRN
  Administered 2016-03-24: 100 ug/kg/min via INTRAVENOUS

## 2016-03-24 MED ORDER — LACTATED RINGERS IV SOLN
INTRAVENOUS | Status: DC | PRN
  Administered 2016-03-24: 13:00:00 via INTRAVENOUS

## 2016-03-24 MED ORDER — CARVEDILOL 3.125 MG PO TABS: 6.2500 mg | ORAL_TABLET | Freq: Two times a day (BID) | ORAL | 6 refills | Status: DC

## 2016-03-24 NOTE — Op Note (Signed)
Procedure: Electrical Cardioversion Indications:  Atrial Fibrillation  Procedure Details:  Consent: Risks of procedure as well as the alternatives and risks of each were explained to the (patient/caregiver).  Consent for procedure obtained.  Time Out: Verified patient identification, verified procedure, site/side was marked, verified correct patient position, special equipment/implants available, medications/allergies/relevent history reviewed, required imaging and test results available.  Performed  Patient placed on cardiac monitor, pulse oximetry, supplemental oxygen as necessary.  Sedation given: IV propofol Pacer pads placed anterior and posterior chest.  Cardioverted 1 time(s).  Cardioversion with synchronized biphasic 120J shock.  Evaluation: Findings: Post procedure EKG shows: NSR Complications: None Patient did tolerate procedure well.  Time Spent Directly with the Patient:  30 minutes   Mark Ponce 03/24/2016, 1:23 PM

## 2016-03-24 NOTE — Progress Notes (Signed)
  Echocardiogram Echocardiogram Transesophageal has been performed.  Mark Ponce 03/24/2016, 2:03 PM

## 2016-03-24 NOTE — Anesthesia Postprocedure Evaluation (Signed)
Anesthesia Post Note  Patient: Mark Ponce  Procedure(s) Performed: Procedure(s) (LRB): TRANSESOPHAGEAL ECHOCARDIOGRAM (TEE) (N/A) CARDIOVERSION (N/A)  Patient location during evaluation: Endoscopy Anesthesia Type: MAC Pain management: pain level controlled Vital Signs Assessment: post-procedure vital signs reviewed and stable Cardiovascular status: stable Anesthetic complications: no    Last Vitals:  Vitals:   03/24/16 1340 03/24/16 1350  BP: (!) 163/118 (!) 168/104  Pulse: 79 77  Resp: 20 (!) 21  Temp:      Last Pain:  Vitals:   03/24/16 1330  TempSrc: Oral                 EDWARDS,Ardena Gangl

## 2016-03-24 NOTE — Anesthesia Procedure Notes (Signed)
Procedure Name: MAC Date/Time: 03/24/2016 1:05 PM Performed by: Rise Patience T Pre-anesthesia Checklist: Patient identified, Emergency Drugs available, Suction available and Patient being monitored Patient Re-evaluated:Patient Re-evaluated prior to inductionOxygen Delivery Method: Simple face mask Preoxygenation: Pre-oxygenation with 100% oxygen Intubation Type: IV induction Placement Confirmation: positive ETCO2 Dental Injury: Teeth and Oropharynx as per pre-operative assessment

## 2016-03-24 NOTE — Op Note (Signed)
INDICATIONS: atrial fibrillation  PROCEDURE:   Informed consent was obtained prior to the procedure. The risks, benefits and alternatives for the procedure were discussed and the patient comprehended these risks.  Risks include, but are not limited to, cough, sore throat, vomiting, nausea, somnolence, esophageal and stomach trauma or perforation, bleeding, low blood pressure, aspiration, pneumonia, infection, trauma to the teeth and death.    After a procedural time-out, the oropharynx was anesthetized with 20% benzocaine spray.   During this procedure the patient was administered IV propofol by Anesthesiology, Dr. Randa Evens to achieve and maintain moderate conscious sedation.  The patient's heart rate, blood pressure, and oxygen saturationweare monitored continuously during the procedure.   The transesophageal probe was inserted in the esophagus and stomach without difficulty and multiple views were obtained.  The patient was kept under observation until the patient left the procedure room.  The patient left the procedure room in stable condition.   Agitated microbubble saline contrast was not administered.  COMPLICATIONS:    There were no immediate complications.  FINDINGS:  No LA thrombus. LVH is present. Normal LVEF and wall motion. No valvular abnormalities. No visible aortic atherosclerosis.  RECOMMENDATIONS:   Proceed with DC cardioversion.  Time Spent Directly with the Patient:  30 minutes   Mark Ponce 03/24/2016, 12:37 PM

## 2016-03-24 NOTE — Interval H&P Note (Signed)
History and Physical Interval Note:  03/24/2016 12:36 PM  Mark Ponce  has presented today for surgery, with the diagnosis of AFIB  The various methods of treatment have been discussed with the patient and family. After consideration of risks, benefits and other options for treatment, the patient has consented to  Procedure(s): TRANSESOPHAGEAL ECHOCARDIOGRAM (TEE) (N/A) CARDIOVERSION (N/A) as a surgical intervention .  The patient's history has been reviewed, patient examined, no change in status, stable for surgery.  I have reviewed the patient's chart and labs.  Questions were answered to the patient's satisfaction.     [name]

## 2016-03-24 NOTE — Anesthesia Preprocedure Evaluation (Addendum)
Anesthesia Evaluation  Patient identified by MRN, date of birth, ID band Patient awake    Reviewed: Allergy & Precautions, NPO status , Patient's Chart, lab work & pertinent test results  History of Anesthesia Complications (+) PONV  Airway Mallampati: II  TM Distance: >3 FB Neck ROM: Full    Dental  (+) Dental Advisory Given, Teeth Intact,    Pulmonary    breath sounds clear to auscultation       Cardiovascular + dysrhythmias Atrial Fibrillation  Rhythm:Irregular Rate:Normal     Neuro/Psych    GI/Hepatic negative GI ROS, Neg liver ROS,   Endo/Other    Renal/GU Renal disease     Musculoskeletal   Abdominal   Peds  Hematology   Anesthesia Other Findings   Reproductive/Obstetrics                          Anesthesia Physical Anesthesia Plan  ASA: III  Anesthesia Plan: MAC   Post-op Pain Management:    Induction: Intravenous  Airway Management Planned: Simple Face Mask and Natural Airway  Additional Equipment:   Intra-op Plan:   Post-operative Plan:   Informed Consent: I have reviewed the patients History and Physical, chart, labs and discussed the procedure including the risks, benefits and alternatives for the proposed anesthesia with the patient or authorized representative who has indicated his/her understanding and acceptance.   Dental advisory given  Plan Discussed with: CRNA, Anesthesiologist and Surgeon  Anesthesia Plan Comments:        Anesthesia Quick Evaluation

## 2016-03-24 NOTE — H&P (View-Only) (Signed)
    CARDIOLOGY CONSULT NOTE   Patient ID: Mark Ponce MRN: [id number] DOB/AGE: 03/14/1971 45 y.o.  Admit date: 03/19/2016  Primary Physician   MILLER,LISA LYNN, MD Primary Cardiologist   New, Dr Dimitria Ketchum Reason for Consultation   Atrial fib, CP & elevated troponin Requesting MD: Dr Niu  HPI:Mark Ponce is a 45 y.o. year old male with no cardiac history, no ongoing medical issues, no surgeries. Has not had a physical in a long time, but has a primary MD he sees occasionally.  Pt had N&V and abdominal pain after Mexican food on 07/21. Seen in ER 07/22, treated and released.    During this, he noticed his heart beating hard and fast but in the ER, his HR was normal, no ECG performed.  His symptoms improved, but he was having episodic fevers and sweats. Because of this, he went to his primary MD's office. He was to take Tylenol. No mention of a rapid or irregular HR. Labs were drawn.  Pt received a call about his labs yesterday pm, being told they were abnormal and he needed to go back to the ER. He went back to [facility] and was transferred to [facility] for admission because of his abnormal rhythm, elevated white count and decreased renal function. He also burst a blood vessel in his L eye during all the vomiting.   He has no real awareness of an irregular HR. He has felt his heart racing, worst in the last 24 hours. No presyncope or syncope.    Past Medical History:  Diagnosis Date  . AKI (acute kidney injury) (HCC) 02/2016  . Hypokalemia 02/2016  . New onset atrial fibrillation (HCC) 02/2016     Past Surgical History:  Procedure Laterality Date  . NO PAST SURGERIES      No Known Allergies  I have reviewed the patient's current medications . aspirin  325 mg Oral Daily  . carvedilol  3.125 mg Oral BID WC  . sodium chloride flush  3 mL Intravenous Q12H   . diltiazem (CARDIZEM) infusion 10 mg/hr (03/20/16 0436)  . heparin 1,400 Units/hr (03/20/16 0429)    acetaminophen **OR** acetaminophen  Prior to Admission medications   Medication Sig Start Date End Date Taking? Authorizing Provider  acetaminophen (TYLENOL) 325 MG tablet Take 650 mg by mouth every 6 (six) hours as needed.   Yes Historical Provider, MD  ondansetron (ZOFRAN ODT) 8 MG disintegrating tablet Take 1 tablet (8 mg total) by mouth every 8 (eight) hours as needed for nausea or vomiting. 03/15/16  Yes John Molpus, MD     Social History   Social History  . Marital status: Single    Spouse name: N/A  . Number of children: N/A  . Years of education: N/A   Occupational History  . Store Manager [employer]    Social History Main Topics  . Smoking status: Never Smoker  . Smokeless tobacco: Never Used  . Alcohol use No  . Drug use: No  . Sexual activity: Not on file   Other Topics Concern  . Not on file   Social History Narrative   Pt lives alone in [place].    Family Status  Relation Status  . Mother Alive  . Father Alive   History reviewed. No pertinent family history.   ROS:  Full 14 point review of systems complete and found to be negative unless listed above.  Physical Exam: Blood pressure 114/82, pulse 75, temperature 98 F (36.7 C),   resp. rate 11, height 5' 11" (1.803 m), weight 223 lb 9.6 oz (101.4 kg), SpO2 99 %.  General: Well developed, well nourished, male in no acute distress Head: Eyes PERRLA, No xanthomas.   Normocephalic and atraumatic, oropharynx without edema or exudate. Dentition: good Lungs: Clear bilaterally Heart: Heart irregular rate and rhythm with S1, S2  murmur. pulses are 2+ all 4 extrem.   Neck: No carotid bruits. No lymphadenopathy.  JVD not elevated Abdomen: Bowel sounds present, abdomen soft and non-tender without masses or hernias noted. Msk:  No spine or cva tenderness. No weakness, no joint deformities or effusions. Extremities: No clubbing or cyanosis. No edema.  Neuro: Alert and oriented X 3. No focal deficits  noted. Psych:  Good affect, responds appropriately Skin: No rashes or lesions noted.  Labs:   Lab Results  Component Value Date   WBC 11.5 (H) 03/20/2016   HGB 14.4 03/20/2016   HCT 42.1 03/20/2016   MCV 86.4 03/20/2016   PLT 230 03/20/2016    Recent Labs  03/20/16 0434  INR 1.26     Recent Labs Lab 03/19/16 2228 03/20/16 0434  NA 132* 134*  K 3.4* 4.0  CL 96* 102  CO2 23 19*  BUN 24* 19  CREATININE 2.11* 1.87*  CALCIUM 9.2 9.3  PROT 7.8  --   BILITOT 1.7*  --   ALKPHOS 90  --   ALT 60  --   AST 32  --   GLUCOSE 96 86  ALBUMIN 4.0  --    Magnesium  Date Value Ref Range Status  03/20/2016 2.4 1.7 - 2.4 mg/dL Final    Recent Labs  03/20/16 0434 03/20/16 0948  TROPONINI 0.71* 0.60*   B Natriuretic Peptide  Date/Time Value Ref Range Status  03/20/2016 04:35 AM 657.5 (H) 0.0 - 100.0 pg/mL Final   Lab Results  Component Value Date   CHOL 184 03/20/2016   HDL 32 (L) 03/20/2016   LDLCALC 134 (H) 03/20/2016   TRIG 88 03/20/2016   Lipase  Date/Time Value Ref Range Status  03/15/2016 05:40 AM 18 11 - 51 U/L Final   TSH  Date/Time Value Ref Range Status  03/20/2016 04:35 AM 1.688 0.350 - 4.500 uIU/mL Final   Echo: pending   ECG:  Atrial fib, HR 139  Radiology:  Dg Chest 2 View Result Date: 03/20/2016 CLINICAL DATA:  Fever. EXAM: CHEST  2 VIEW COMPARISON:  03/19/2016. FINDINGS: Mediastinum hilar structures normal. Stable cardiomegaly. Mild left base subsegmental atelectasis. Questionable pulmonary nodules in the apices bilaterally, most likely overlying structures. Follow-up PA and lateral chest x-ray is suggested to demonstrate resolution . No pleural effusion or pneumothorax. No acute bony abnormality. IMPRESSION: 1. Cardiomegaly.  No pulmonary venous congestion. 2. Questionable pulmonary nodules noted projected over the apices, most likely overlapping structures. Follow-up PA and lateral chest x-ray suggested. Mild left base subsegmental atelectasis.  No focal pulmonary infiltrate. Electronically Signed   By: Thomas  Register   On: 03/20/2016 06:54  Us Renal Result Date: 03/20/2016 CLINICAL DATA:  45-year-old male with increased BUN and creatinine. Acute renal insufficiency. EXAM: RENAL / URINARY TRACT ULTRASOUND COMPLETE COMPARISON:  None. FINDINGS: Evaluation is limited due to portable technique. Right Kidney: Length: 12 cm. There is diffuse increased echotexture. No hydronephrosis or echogenic stone. Left Kidney: Length: 13 cm. There is a large area of defect and indentation of the upper pole parenchyma may represent scarring. There is diffuse increased echotexture. No hydronephrosis or echogenic stone. Bladder: Appears normal for   degree of bladder distention. IMPRESSION: No hydronephrosis or echogenic stone. Increased renal echotexture, likely related to underlying medical renal disease. Clinical correlation is recommended. Electronically Signed   By: Arash  Radparvar M.D.   On: 03/20/2016 05:17  Dg Chest Port 1 View Result Date: 03/19/2016 CLINICAL DATA:  45-year-old male with atrial fibrillation. No chest complaints. EXAM: PORTABLE CHEST 1 VIEW COMPARISON:  None FINDINGS: Single portable view of the chest demonstrates clear lungs. There is no pleural effusion or pneumothorax. Top-normal cardiac silhouette. No acute osseous pathology. IMPRESSION: No active disease. Electronically Signed   By: Arash  Radparvar M.D.   On: 03/19/2016 23:26   ASSESSMENT AND PLAN:   The patient was seen today by Dr Inell Mimbs, the patient evaluated and the data reviewed.   Principal Problem:   Atrial fibrillation with RVR (HCC) - persistent - unknown duration, but likely only for the last few days. - unable to be completely sure if started within 48 hours. - TSH is normal - Rate control improved on IV Cardizem and low-dose Coreg - echo ordered, will try to get done today. - feel this is from acute illness, will try to restore SR - schedule TEE/DCCV for tomorrow  as long as EF is normal - CHADS2VASC is probably 1 as BP has been elevated since admission and pt has been told his BP was elevated in the past. - no known hx OSA  Otherwise, per IM, will start oral anticoagulation and change Cardizem to PO. Active Problems:   AKI (acute kidney injury) (HCC)   New onset atrial fibrillation (HCC)   Fever, unspecified   Hypokalemia   Nausea with vomiting   Abdominal pain    Signed: Barrett, Rhonda, PA-C 03/20/2016 11:06 AM Beeper [phone]  Co-Sign MD  I have seen and examined the patient along with Barrett, Rhonda, PA-C.  I have reviewed the chart, notes and new data.  I agree with PA's note.  Key new complaints: asymptomatic until last few days, palpitations first noted about 3 days prior to admission, preceded by acute GI illness. No previously known cardiac risk factors other than "borderline high BP" and obesity. Adverse lipid profile noted on this admission (low HDL, mildly high LDL). No hypersomnolence, angina, dyspnea, syncope, edema, heat-cold intolerance, weight changes, etc. Key examination changes: irregular rhythm, otherwise normal exam Key new findings / data: hyperechoic kidneys on US; marginal increase in cardiac troponin (without typical "rise and fall" pattern) and elevated BNP.  PLAN: Persistent AF, now rate controlled. CHADSVasc score may be as low as zero (if his BP is really usually normal), but further evaluation for HTN and CAD is necessary (may be 2 or 3 depending on presence/absence of CAD and LV dysfunction)  Transition from IV to oral diltiazem and from IV heparin to direct oral anticoagulant. Check echo. If regional wall motion abnormalities or markedly decreased LVEF found, will need coronary angiography. Otherwise prefer outpatient stress Myoview after rhythm has normalized. If there is evidence of right heart enlargement or pulmonary HTN, plan sleep study. If echo is normal, still recommend weight loss. Lipid profile  and risk of future arrhythmia will both improve with weight loss and exercise. If he remains in AF tomorrow, plan TEE-DCCV. At this point, the schedule for tomorrow is full. If his procedure cannot be completed tomorrow, can bring him back for an outpatient TEE-DCCV next week. Duration of recommended anticoagulation depends heavily on echo findings. Would plan 30 days DOAC after cardioversion if echo is normal, lifelong if there is   LV dysfunction or substantial LA enlargement. Renal function is improving, but US findings are of concern. He may have more longstanding HTN than we think or his HTN may be secondary to intrinsic renal disease.  Joyleen Haselton, MD, FACC [facility] [phone] 03/20/2016, 11:44 AM  

## 2016-03-24 NOTE — Transfer of Care (Signed)
Immediate Anesthesia Transfer of Care Note  Patient: Mark Ponce  Procedure(s) Performed: Procedure(s): TRANSESOPHAGEAL ECHOCARDIOGRAM (TEE) (N/A) CARDIOVERSION (N/A)  Patient Location: Endoscopy Unit  Anesthesia Type:MAC  Level of Consciousness: awake, alert  and oriented  Airway & Oxygen Therapy: Patient Spontanous Breathing  Post-op Assessment: Report given to RN, Post -op Vital signs reviewed and stable and Patient moving all extremities X 4  Post vital signs: Reviewed and stable  Last Vitals:  Vitals:   03/24/16 1223  BP: (!) 168/108  Pulse: (!) 131  Resp: 20  Temp: 36.7 C    Last Pain:  Vitals:   03/24/16 1223  TempSrc: Oral         Complications: No apparent anesthesia complications

## 2016-03-24 NOTE — Discharge Instructions (Signed)
Electrical Cardioversion, Care After °Refer to this sheet in the next few weeks. These instructions provide you with information on caring for yourself after your procedure. Your health care provider may also give you more specific instructions. Your treatment has been planned according to current medical practices, but problems sometimes occur. Call your health care provider if you have any problems or questions after your procedure. °WHAT TO EXPECT AFTER THE PROCEDURE °After your procedure, it is typical to have the following sensations: °· Some redness on the skin where the shocks were delivered. If this is tender, a sunburn lotion or hydrocortisone cream may help. °· Possible return of an abnormal heart rhythm within hours or days after the procedure. °HOME CARE INSTRUCTIONS °· Take medicines only as directed by your health care provider. Be sure you understand how and when to take your medicine. °· Learn how to feel your pulse and check it often. °· Limit your activity for 48 hours after the procedure or as directed by your health care provider. °· Avoid or minimize caffeine and other stimulants as directed by your health care provider. °SEEK MEDICAL CARE IF: °· You feel like your heart is beating too fast or your pulse is not regular. °· You have any questions about your medicines. °· You have bleeding that will not stop. °SEEK IMMEDIATE MEDICAL CARE IF: °· You are dizzy or feel faint. °· It is hard to breathe or you feel short of breath. °· There is a change in discomfort in your chest. °· Your speech is slurred or you have trouble moving an arm or leg on one side of your body. °· You get a serious muscle cramp that does not go away. °· Your fingers or toes turn cold or blue. °  °This information is not intended to replace advice given to you by your health care provider. Make sure you discuss any questions you have with your health care provider. °  °Document Released: 06/01/2013 Document Revised: 09/01/2014  Document Reviewed: 06/01/2013 °Elsevier Interactive Patient Education ©2016 Elsevier Inc. ° °

## 2016-03-25 ENCOUNTER — Encounter (HOSPITAL_COMMUNITY): Payer: Self-pay | Admitting: Cardiovascular Disease

## 2016-03-25 LAB — CULTURE, BLOOD (ROUTINE X 2)
CULTURE: NO GROWTH
Culture: NO GROWTH

## 2016-03-26 ENCOUNTER — Ambulatory Visit (INDEPENDENT_AMBULATORY_CARE_PROVIDER_SITE_OTHER): Payer: BLUE CROSS/BLUE SHIELD | Admitting: Pharmacist Clinician (PhC)/ Clinical Pharmacy Specialist

## 2016-03-26 ENCOUNTER — Telehealth (HOSPITAL_COMMUNITY): Payer: Self-pay | Admitting: *Deleted

## 2016-03-26 DIAGNOSIS — I4891 Unspecified atrial fibrillation: Secondary | ICD-10-CM | POA: Diagnosis not present

## 2016-03-26 MED ORDER — CARVEDILOL 6.25 MG PO TABS: 6.2500 mg | ORAL_TABLET | Freq: Two times a day (BID) | ORAL | 5 refills | Status: DC

## 2016-03-26 NOTE — Assessment & Plan Note (Signed)
Reviewed diagnosis information with patient as well as safety concerns regarding warfarin.  All of his questions were answered to his satisfaction.  He knows to call our office with any further concerns.

## 2016-03-26 NOTE — Patient Instructions (Signed)
Rivaroxaban oral tablets What is this medicine? RIVAROXABAN (ri va ROX a ban) is an anticoagulant (blood thinner). It is used to treat blood clots in the lungs or in the veins. It is also used after knee or hip surgeries to prevent blood clots. It is also used to lower the chance of stroke in people with a medical condition called atrial fibrillation. This medicine may be used for other purposes; ask your health care provider or pharmacist if you have questions. What should I tell my health care provider before I take this medicine? They need to know if you have any of these conditions: -bleeding disorders -bleeding in the brain -blood in your stools (black or tarry stools) or if you have blood in your vomit -history of stomach bleeding -kidney disease -liver disease -low blood counts, like low white cell, platelet, or red cell counts -recent or planned spinal or epidural procedure -take medicines that treat or prevent blood clots -an unusual or allergic reaction to rivaroxaban, other medicines, foods, dyes, or preservatives -pregnant or trying to get pregnant -breast-feeding How should I use this medicine? Take this medicine by mouth with a glass of water. Follow the directions on the prescription label. Take your medicine at regular intervals. Do not take it more often than directed. Do not stop taking except on your doctor's advice. Stopping this medicine may increase your risk of a blood clot. Be sure to refill your prescription before you run out of medicine. If you are taking this medicine after hip or knee replacement surgery, take it with or without food. If you are taking this medicine for atrial fibrillation, take it with your evening meal. If you are taking this medicine to treat blood clots, take it with food at the same time each day. If you are unable to swallow your tablet, you may crush the tablet and mix it in applesauce. Then, immediately eat the applesauce. You should eat more  food right after you eat the applesauce containing the crushed tablet. Talk to your pediatrician regarding the use of this medicine in children. Special care may be needed. Overdosage: If you think you have taken too much of this medicine contact a poison control center or emergency room at once. NOTE: This medicine is only for you. Do not share this medicine with others. What if I miss a dose? If you take your medicine once a day and miss a dose, take the missed dose as soon as you remember. If you take your medicine twice a day and miss a dose, take the missed dose immediately. In this instance, 2 tablets may be taken at the same time. The next day you should take 1 tablet twice a day as directed. What may interact with this medicine? -aspirin and aspirin-like medicines -certain antibiotics like erythromycin, azithromycin, and clarithromycin -certain medicines for fungal infections like ketoconazole and itraconazole -certain medicines for irregular heart beat like amiodarone, quinidine, dronedarone -certain medicines for seizures like carbamazepine, phenytoin -certain medicines that treat or prevent blood clots like warfarin, enoxaparin, and dalteparin -conivaptan -diltiazem -felodipine -indinavir -lopinavir; ritonavir -NSAIDS, medicines for pain and inflammation, like ibuprofen or naproxen -ranolazine -rifampin -ritonavir -St. John's wort -verapamil This list may not describe all possible interactions. Give your health care provider a list of all the medicines, herbs, non-prescription drugs, or dietary supplements you use. Also tell them if you smoke, drink alcohol, or use illegal drugs. Some items may interact with your medicine. What should I watch for while using this   medicine? Visit your doctor or health care professional for regular checks on your progress. Your condition will be monitored carefully while you are receiving this medicine. Notify your doctor or health care  professional and seek emergency treatment if you develop breathing problems; changes in vision; chest pain; severe, sudden headache; pain, swelling, warmth in the leg; trouble speaking; sudden numbness or weakness of the face, arm, or leg. These can be signs that your condition has gotten worse. If you are going to have surgery, tell your doctor or health care professional that you are taking this medicine. Tell your health care professional that you use this medicine before you have a spinal or epidural procedure. Sometimes people who take this medicine have bleeding problems around the spine when they have a spinal or epidural procedure. This bleeding is very rare. If you have a spinal or epidural procedure while on this medicine, call your health care professional immediately if you have back pain, numbness or tingling (especially in your legs and feet), muscle weakness, paralysis, or loss of bladder or bowel control. Avoid sports and activities that might cause injury while you are using this medicine. Severe falls or injuries can cause unseen bleeding. Be careful when using sharp tools or knives. Consider using an electric razor. Take special care brushing or flossing your teeth. Report any injuries, bruising, or red spots on the skin to your doctor or health care professional. What side effects may I notice from receiving this medicine? Side effects that you should report to your doctor or health care professional as soon as possible: -allergic reactions like skin rash, itching or hives, swelling of the face, lips, or tongue -back pain -redness, blistering, peeling or loosening of the skin, including inside the mouth -signs and symptoms of bleeding such as bloody or black, tarry stools; red or dark-brown urine; spitting up blood or brown material that looks like coffee grounds; red spots on the skin; unusual bruising or bleeding from the eye, gums, or nose Side effects that usually do not require  medical attention (Report these to your doctor or health care professional if they continue or are bothersome.): -dizziness -muscle pain This list may not describe all possible side effects. Call your doctor for medical advice about side effects. You may report side effects to FDA at 1-800-FDA-1088. Where should I keep my medicine? Keep out of the reach of children. Store at room temperature between 15 and 30 degrees C (59 and 86 degrees F). Throw away any unused medicine after the expiration date. NOTE: This sheet is a summary. It may not cover all possible information. If you have questions about this medicine, talk to your doctor, pharmacist, or health care provider.    2016, Elsevier/Gold Standard. (2014-08-09 12:45:34)  

## 2016-03-26 NOTE — Progress Notes (Signed)
Pt was started on Xarelto for atrial fibrillation on March 21, 2016   Reviewed patients medication list.  Pt is not currently on any combined P-gp and strong CYP3A4 inhibitors/inducers (ketoconazole, traconazole, ritonavir, carbamazepine, phenytoin, rifampin, St. John's wort).  Reviewed labs.  SCr 1.68 (in hospital), Weight 221 lb, CrCl- 57.89 (w/ IBW).  Dose appropriate based on CrCl.   Hgb and HCT WNL  A full discussion of the nature of anticoagulants has been carried out.  A benefit/risk analysis has been presented to the patient, so that they understand the justification for choosing anticoagulation with Xarelto at this time.  The need for compliance is stressed.  Pt is aware to take the medication once daily with the largest meal of the day.  Side effects of potential bleeding are discussed, including unusual colored urine or stools, coughing up blood or coffee ground emesis, nose bleeds or serious fall or head trauma.  Discussed signs and symptoms of stroke. The patient should avoid any OTC items containing aspirin or ibuprofen.  Avoid alcohol consumption.   Call if any signs of abnormal bleeding.  Discussed financial obligations and resolved any difficulty in obtaining medication.  Next lab test test in 6 months.

## 2016-03-26 NOTE — Telephone Encounter (Signed)
.  Attempted to call patient regarding upcoming nuclear appointment- no answer x 2. Antionette Char, RN

## 2016-03-28 ENCOUNTER — Telehealth: Payer: Self-pay | Admitting: Cardiovascular Disease

## 2016-03-28 NOTE — Telephone Encounter (Signed)
Follow up   Pt is calling to request a letter that states that he can return back to work

## 2016-03-28 NOTE — Telephone Encounter (Signed)
New message       The pt was seen in the office and was told to return to work by the cardiologist   The pt's work place is saying he needs a return to work note saying it is ok to do so

## 2016-03-28 NOTE — Telephone Encounter (Signed)
Will need clarification on this. Pt seen in hospital but has pending nuc and hosp f/u w Jacolyn Reedy.  He had anticoag appt w Belenda Cruise.  Will route to Dr. Royann Shivers.

## 2016-03-31 ENCOUNTER — Encounter: Payer: Self-pay | Admitting: Cardiovascular Disease

## 2016-03-31 ENCOUNTER — Ambulatory Visit (HOSPITAL_COMMUNITY): Payer: BLUE CROSS/BLUE SHIELD | Attending: Physician Assistant

## 2016-03-31 DIAGNOSIS — I4891 Unspecified atrial fibrillation: Secondary | ICD-10-CM | POA: Diagnosis not present

## 2016-03-31 DIAGNOSIS — R778 Other specified abnormalities of plasma proteins: Secondary | ICD-10-CM

## 2016-03-31 DIAGNOSIS — R7989 Other specified abnormal findings of blood chemistry: Secondary | ICD-10-CM | POA: Diagnosis not present

## 2016-03-31 DIAGNOSIS — I4892 Unspecified atrial flutter: Secondary | ICD-10-CM | POA: Diagnosis not present

## 2016-03-31 MED ORDER — TECHNETIUM TC 99M TETROFOSMIN IV KIT
11.0000 | PACK | Freq: Once | INTRAVENOUS | Status: AC | PRN
  Administered 2016-03-31: 11 via INTRAVENOUS
  Filled 2016-03-31: qty 11

## 2016-03-31 NOTE — Telephone Encounter (Signed)
Follow up  Pt is returning Mark Ponce call.  Please follow up with pt.

## 2016-03-31 NOTE — Telephone Encounter (Signed)
Letter in Epic with today as return to work date. Then I saw he is having nuke study today. Please let me know if it needs to be changed to tomorrow return date.

## 2016-03-31 NOTE — Telephone Encounter (Signed)
lmtcb

## 2016-04-02 ENCOUNTER — Ambulatory Visit: Payer: BLUE CROSS/BLUE SHIELD | Admitting: Cardiovascular Disease

## 2016-04-02 NOTE — Telephone Encounter (Signed)
lmtcb

## 2016-04-03 NOTE — Telephone Encounter (Signed)
Called patient. We went over the letter and he said that was sufficient. He will call me back with the fax number to his work once he gets to work so I may fax the letter to him.

## 2016-04-07 ENCOUNTER — Ambulatory Visit (INDEPENDENT_AMBULATORY_CARE_PROVIDER_SITE_OTHER): Payer: BLUE CROSS/BLUE SHIELD | Admitting: Cardiovascular Disease

## 2016-04-07 ENCOUNTER — Telehealth (HOSPITAL_COMMUNITY): Payer: Self-pay | Admitting: *Deleted

## 2016-04-07 ENCOUNTER — Encounter: Payer: Self-pay | Admitting: Cardiovascular Disease

## 2016-04-07 ENCOUNTER — Encounter: Payer: BLUE CROSS/BLUE SHIELD | Admitting: Physician Assistant

## 2016-04-07 VITALS — BP 94/62 | HR 60 | Ht 71.0 in | Wt 213.0 lb

## 2016-04-07 DIAGNOSIS — N182 Chronic kidney disease, stage 2 (mild): Secondary | ICD-10-CM

## 2016-04-07 DIAGNOSIS — R7989 Other specified abnormal findings of blood chemistry: Secondary | ICD-10-CM

## 2016-04-07 DIAGNOSIS — R778 Other specified abnormalities of plasma proteins: Secondary | ICD-10-CM

## 2016-04-07 DIAGNOSIS — I481 Persistent atrial fibrillation: Secondary | ICD-10-CM | POA: Diagnosis not present

## 2016-04-07 DIAGNOSIS — I10 Essential (primary) hypertension: Secondary | ICD-10-CM | POA: Diagnosis not present

## 2016-04-07 DIAGNOSIS — I4819 Other persistent atrial fibrillation: Secondary | ICD-10-CM

## 2016-04-07 MED ORDER — CARVEDILOL 6.25 MG PO TABS: 6.2500 mg | ORAL_TABLET | Freq: Two times a day (BID) | ORAL | 11 refills | Status: DC

## 2016-04-07 NOTE — Patient Instructions (Signed)
Dr Royann Shiversroitoru has recommended making the following medication changes: 1. DECREASE Carvedilol to 6.25 mg tablets  Your physician has requested that you have a lexiscan myoview-stress images only. For further information please visit [email or web address]. Please follow instruction sheet, as given.   Dr Royann Shiversroitoru recommends that you schedule a follow-up appointment first available.  If you need a refill on your cardiac medications before your next appointment, please call your pharmacy.

## 2016-04-07 NOTE — Progress Notes (Signed)
Cardiology Office Note    Date:  04/08/2016   ID:  Mark MinaWilliam C Ponce, DOB 10/18/1970, MRN [id number]  PCP:  Neldon LabellaMILLER,LISA LYNN, MD  Cardiologist:   Thurmon FairMihai Lien Lyman, MD   chief complaint: Atrial fibrillation follow-up   History of Present Illness:  Mark Ponce is a 45 y.o. male with recently diagnosed persistent atrial fibrillation undergoing elective cardioversion, now returns for follow-up and is back in atrial fibrillation. He does not to be truly aware of any change in his rhythm, but does complain of some weakness and fatigue. His heart rate is borderline slow at 60 bpm and he is mildly hypotensive on a combination of carvedilol and diltiazem. He has yet to complete his nuclear perfusion study, since the stress images were canceled due to recurrent arrhythmia.  He denies angina or dyspnea either at rest or with activity. He has not had problems with edema. He denies focal neurological events. He has not had any bleeding on Xarelto.  His echocardiogram demonstrated mild left ventricular hypertrophy and mild left atrial dilatation. There was normal left ventricular systolic function and there were no regional wall motion abnormalities. He did have very slight increase in cardiac troponin I after his initial presentation with atrial fibrillation rapid ventricular response. Onset of illness was associated with GI upset with severe nausea and vomiting.  He has also been found to have persistently abnormal renal function tests, suggesting of chronic kidney disease stage II. Overall the impression is of long-standing untreated systemic hypertension.  Past Medical History:  Diagnosis Date  . AKI (acute kidney injury) (HCC) 02/2016  . Hypokalemia 02/2016  . New onset atrial fibrillation (HCC) 02/2016    Past Surgical History:  Procedure Laterality Date  . CARDIOVERSION N/A 03/24/2016   Procedure: CARDIOVERSION;  Surgeon: Thurmon FairMihai Kalilah Barua, MD;  Location: [facility];  Service:  Cardiovascular;  Laterality: N/A;  . NO PAST SURGERIES    . TEE WITHOUT CARDIOVERSION N/A 03/24/2016   Procedure: TRANSESOPHAGEAL ECHOCARDIOGRAM (TEE);  Surgeon: Thurmon FairMihai Sadira Standard, MD;  [facility];  Service: Cardiovascular;  Laterality: N/A;    Current Medications: Outpatient Medications Prior to Visit  Medication Sig Dispense Refill  . diltiazem (CARDIZEM CD) 240 MG 24 hr capsule Take 1 capsule (240 mg total) by mouth daily. 30 capsule 0  . ondansetron (ZOFRAN ODT) 8 MG disintegrating tablet Take 1 tablet (8 mg total) by mouth every 8 (eight) hours as needed for nausea or vomiting. 10 tablet 0  . acetaminophen (TYLENOL) 325 MG tablet Take 650 mg by mouth every 6 (six) hours as needed.    . carvedilol (COREG) 6.25 MG tablet Take 1 tablet (6.25 mg total) by mouth 2 (two) times daily. (Patient taking differently: Take 12.5 mg by mouth 2 (two) times daily. ) 60 tablet 5  . rivaroxaban (XARELTO) 20 MG TABS tablet Take 1 tablet (20 mg total) by mouth daily with supper. 30 tablet 0   No facility-administered medications prior to visit.      Allergies:   Review of patient's allergies indicates no known allergies.   Social History   Social History  . Marital status: Single    Spouse name: N/A  . Number of children: N/A  . Years of education: N/A   Occupational History  . 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 Smoking status: Never Smoker  . Smokeless tobacco: Never Used  . Alcohol use No  . Drug use: No  . Sexual activity: Not Asked  Other Topics Concern  . None   Social History Narrative   [place] lives alone in [place]. His stepfather died of an MI, but his biological father and his mother have no known CAD.     Family History:  The patient's family history is not on file.   ROS:   Please see the history of present illness.    ROS All other systems reviewed and are negative.   PHYSICAL EXAM:   VS:  BP 94/62   Pulse 60   Ht 5\' 11"  (1.803 m)    Wt 213 lb (96.6 kg)   BMI 29.71 kg/m    GEN: Well nourished, well developed, in no acute distress  HEENT: normal  Neck: no JVD, carotid bruits, or masses Cardiac: irregular; no murmurs, rubs, or gallops,no edema  Respiratory:  clear to auscultation bilaterally, normal work of breathing GI: soft, nontender, nondistended, + BS MS: no deformity or atrophy  Skin: warm and dry, no rash Neuro:  Alert and Oriented x 3, Strength and sensation are intact Psych: euthymic mood, full affect  Wt Readings from Last 3 Encounters:  04/07/16 213 lb (96.6 kg)  04/08/16 221 lb (100.2 kg)  03/21/16 221 lb 3.2 oz (100.3 kg)      Studies/Labs Reviewed:   EKG:  EKG is ordered today.  The ekg ordered today demonstrates Atrial fibrillation with controlled ventricular rate, borderline bradycardic; inverted T waves in leads V5-V6 Ammann probably secondary to left ventricular hypertrophy  Recent Labs: 03/19/2016: ALT 60 03/20/2016: B Natriuretic Peptide 657.5; Magnesium 2.4; TSH 1.688 03/21/2016: BUN 13; Creatinine, Ser 1.68; Hemoglobin 12.7; Platelets 226; Potassium 4.2; Sodium 136   Lipid Panel    Component Value Date/Time   CHOL 184 03/20/2016 0404   TRIG 88 03/20/2016 0404   HDL 32 (L) 03/20/2016 0404   CHOLHDL 5.8 03/20/2016 0404   VLDL 18 03/20/2016 0404   LDLCALC 134 (H) 03/20/2016 0404       ASSESSMENT:    1. Persistent atrial fibrillation (HCC)   2. Essential hypertension   3. CKD (chronic kidney disease) stage 2, GFR 60-89 ml/min   4. Elevated troponin      PLAN:  In order of problems listed above:  1. AFib: Rate is well controlled, probably excessively controlled, decrease carvedilol to 6.25 mg twice a day. I still think that at his young age it is worth trying to restore normal rhythm. It appears that this will require antiarrhythmic therapy either with pharmacological means or radiofrequency ablation. Need to exclude underlying coronary disease before we choose the best  course. I would recommend that he remain on anticoagulation for the time being, although it appears that his embolic risk is low (CHADSVasc 1). 2. HTN: Similarly, his blood pressure is now excessively reduced. Reduced beta blocker. 3. CKD: Presumably secondary to hypertensive nephrosclerosis. However his urinalysis was fairly active with hematuria and renal tubular epithelial cells. It's not clear if this was possibly due to some degree of residual ATN following GI distress / dehydration or whether it may be a marker of intrinsic kidney disease. Will repeat creatinine and urinalysis before his next appointment plans for referral to nephrology if these abnormalities persist. 4. He never had angina. Suspect this was related to acute tachyarrhythmia. Nuclear stress test pending.   Medication Adjustments/Labs and Tests Ordered: Current medicines are reviewed at length with the patient today.  Concerns regarding medicines are outlined above.  Medication changes, Labs and Tests ordered today are listed in the Patient Instructions  below. Patient Instructions  Dr Royann Shivers has recommended making the following medication changes: 1. DECREASE Carvedilol to 6.25 mg tablets  Your physician has requested that you have a lexiscan myoview-stress images only. For further information please visit [email or web address] Please follow instruction sheet, as given.   Dr Royann Shivers recommends that you schedule a follow-up appointment first available.  If you need a refill on your cardiac medications before your next appointment, please call your pharmacy.    Signed, Thurmon Fair, MD  04/08/2016 2:06 PM    [facility] [address] [place], [place], [place]  [address] Phone: [phone]; Fax: [phone]

## 2016-04-07 NOTE — Telephone Encounter (Signed)
Left message on voicemail per DPR in reference to upcoming appointment scheduled on 04/08/16 at 0715 with detailed instructions given per Myocardial Perfusion Study Information Sheet for the test. LM to arrive 15 minutes early, and that it is imperative to arrive on time for appointment to keep from having the test rescheduled. If you need to cancel or reschedule your appointment, please call the office within 24 hours of your appointment. Failure to do so may result in a cancellation of your appointment, and a $50 no show fee. Phone number given for call back for any questions.

## 2016-04-08 ENCOUNTER — Ambulatory Visit (HOSPITAL_COMMUNITY): Payer: BLUE CROSS/BLUE SHIELD | Attending: Cardiovascular Disease

## 2016-04-08 DIAGNOSIS — N183 Chronic kidney disease, stage 3 unspecified: Secondary | ICD-10-CM | POA: Insufficient documentation

## 2016-04-08 DIAGNOSIS — R7989 Other specified abnormal findings of blood chemistry: Secondary | ICD-10-CM | POA: Diagnosis not present

## 2016-04-08 DIAGNOSIS — I10 Essential (primary) hypertension: Secondary | ICD-10-CM | POA: Insufficient documentation

## 2016-04-08 DIAGNOSIS — I4892 Unspecified atrial flutter: Secondary | ICD-10-CM | POA: Diagnosis not present

## 2016-04-08 DIAGNOSIS — I4891 Unspecified atrial fibrillation: Secondary | ICD-10-CM | POA: Diagnosis not present

## 2016-04-08 LAB — MYOCARDIAL PERFUSION IMAGING
CHL CUP NUCLEAR SRS: 9
CHL CUP NUCLEAR SSS: 2
CHL CUP RESTING HR STRESS: 63 {beats}/min
CSEPPHR: 80 {beats}/min
RATE: 0.15
SDS: 0
TID: 1.11

## 2016-04-08 MED ORDER — REGADENOSON 0.4 MG/5ML IV SOLN
0.4000 mg | Freq: Once | INTRAVENOUS | Status: AC
  Administered 2016-04-08: 0.4 mg via INTRAVENOUS

## 2016-04-08 MED ORDER — TECHNETIUM TC 99M TETROFOSMIN IV KIT
33.0000 | PACK | Freq: Once | INTRAVENOUS | Status: AC | PRN
  Administered 2016-04-08: 33 via INTRAVENOUS
  Filled 2016-04-08: qty 33

## 2016-04-09 ENCOUNTER — Telehealth: Payer: Self-pay

## 2016-04-09 DIAGNOSIS — R319 Hematuria, unspecified: Secondary | ICD-10-CM

## 2016-04-09 NOTE — Telephone Encounter (Signed)
-----   Message from Thurmon FairMihai Croitoru, MD sent at 04/08/2016  3:58 PM EDT ----- He needs a repeat urinalysis and basic metabolic panel before his next appointment please

## 2016-04-10 ENCOUNTER — Telehealth: Payer: Self-pay | Admitting: Cardiovascular Disease

## 2016-04-10 NOTE — Telephone Encounter (Signed)
Labs still pending. Attempted to reach patient, no answer when dialed.

## 2016-04-10 NOTE — Telephone Encounter (Signed)
Labs ordered. Patient notified and agreeable to plan.

## 2016-04-10 NOTE — Telephone Encounter (Signed)
New message    Pt calling about the results of his lab. Please call.

## 2016-04-11 NOTE — Telephone Encounter (Signed)
Follow up    Pt verbalized that he is calling for lab results

## 2016-04-15 ENCOUNTER — Telehealth: Payer: Self-pay | Admitting: Cardiovascular Disease

## 2016-04-15 NOTE — Telephone Encounter (Signed)
Returned call to patient-made aware of stress test results.    Notes Recorded by Ellsworth LennoxBrittany M Strader, PA on 04/09/2016 at 4:45 PM EDT Please let the patient know his stress test was low-risk and showed no evidence of ischemia. Normal EKG and perfusion imaging.  Pt verbalized understanding and aware of appt on Thursday 8/24 @ 1030 with [facility] at [facility] for f/u.

## 2016-04-15 NOTE — Telephone Encounter (Signed)
Patient notified of stress test results by H. Cedric FishmanSharpe, RN Patient has MD OV 8/24

## 2016-04-15 NOTE — Telephone Encounter (Signed)
New message ° ° ° ° ° ° °Pt returning nurse call  °

## 2016-04-16 DIAGNOSIS — R319 Hematuria, unspecified: Secondary | ICD-10-CM | POA: Diagnosis not present

## 2016-04-17 ENCOUNTER — Encounter: Payer: Self-pay | Admitting: Cardiovascular Disease

## 2016-04-17 ENCOUNTER — Ambulatory Visit (INDEPENDENT_AMBULATORY_CARE_PROVIDER_SITE_OTHER): Payer: BLUE CROSS/BLUE SHIELD | Admitting: Cardiovascular Disease

## 2016-04-17 VITALS — BP 102/65 | HR 57 | Ht 71.0 in | Wt 213.4 lb

## 2016-04-17 DIAGNOSIS — I4819 Other persistent atrial fibrillation: Secondary | ICD-10-CM

## 2016-04-17 DIAGNOSIS — Z01812 Encounter for preprocedural laboratory examination: Secondary | ICD-10-CM

## 2016-04-17 DIAGNOSIS — I481 Persistent atrial fibrillation: Secondary | ICD-10-CM | POA: Diagnosis not present

## 2016-04-17 DIAGNOSIS — R778 Other specified abnormalities of plasma proteins: Secondary | ICD-10-CM

## 2016-04-17 DIAGNOSIS — N182 Chronic kidney disease, stage 2 (mild): Secondary | ICD-10-CM

## 2016-04-17 DIAGNOSIS — I10 Essential (primary) hypertension: Secondary | ICD-10-CM | POA: Diagnosis not present

## 2016-04-17 DIAGNOSIS — R7989 Other specified abnormal findings of blood chemistry: Secondary | ICD-10-CM

## 2016-04-17 LAB — URINALYSIS
BILIRUBIN URINE: NEGATIVE
GLUCOSE, UA: NEGATIVE
KETONES UR: NEGATIVE
Leukocytes, UA: NEGATIVE
Nitrite: NEGATIVE
PROTEIN: NEGATIVE
Specific Gravity, Urine: 1.02 (ref 1.001–1.035)
pH: 6 (ref 5.0–8.0)

## 2016-04-17 LAB — BASIC METABOLIC PANEL
BUN: 24 mg/dL (ref 7–25)
CALCIUM: 9.8 mg/dL (ref 8.6–10.3)
CO2: 24 mmol/L (ref 20–31)
CREATININE: 1.59 mg/dL — AB (ref 0.60–1.35)
Chloride: 105 mmol/L (ref 98–110)
GLUCOSE: 85 mg/dL (ref 65–99)
Potassium: 4.6 mmol/L (ref 3.5–5.3)
Sodium: 137 mmol/L (ref 135–146)

## 2016-04-17 MED ORDER — FLECAINIDE ACETATE 50 MG PO TABS: 50.0000 mg | ORAL_TABLET | Freq: Two times a day (BID) | ORAL | 11 refills | Status: DC

## 2016-04-17 NOTE — Progress Notes (Signed)
Cardiology Office Note    Date:  04/17/2016   ID:  Mark MinaWilliam C Gawronski, DOB 12/16/1970, MRN [id number]  PCP:  Neldon LabellaMILLER,LISA LYNN, MD  Cardiologist:   Thurmon FairMihai Nakeyia Menden, MD   chief complaint: Atrial fibrillation follow-up   History of Present Illness:  Mark Ponce is a 45 y.o. male with recently diagnosed persistent atrial fibrillation And early recurrence after initial attempt at elective cardioversion, now returns for follow-up and is still in atrial fibrillation. Rate control remains excellent even after we reduced his beta blocker dose. He seems to be essentially asymptomatic  He denies angina or dyspnea either at rest or with activity. He has not had problems with edema. He denies focal neurological events. He has not had any bleeding on Xarelto.  His echocardiogram demonstrated mild left ventricular hypertrophy and mild left atrial dilatation. There was normal left ventricular systolic function and there were no regional wall motion abnormalities. His nuclear stress test is normal. He did have very slight increase in cardiac troponin I after his initial presentation with atrial fibrillation rapid ventricular response. Onset of illness was associated with GI upset with severe nausea and vomiting.  He has also been found to have persistently abnormal renal function tests, suggesting of chronic kidney disease stage II. These have been slowly improving from their worst level, suggesting he had a component of acute tubular necrosis related to nausea/vomiting and hypovolemia. Creatinine remains elevated at roughly 1.5.   Overall the impression is of long-standing untreated systemic hypertension. Blood pressure is now normal.  Past Medical History:  Diagnosis Date  . AKI (acute kidney injury) (HCC) 02/2016  . Hypokalemia 02/2016  . New onset atrial fibrillation (HCC) 02/2016    Past Surgical History:  Procedure Laterality Date  . CARDIOVERSION N/A 03/24/2016   Procedure: CARDIOVERSION;   Surgeon: Thurmon FairMihai Akima Slaugh, MD;  Location: [facility];  Service: Cardiovascular;  Laterality: N/A;  . NO PAST SURGERIES    . TEE WITHOUT CARDIOVERSION N/A 03/24/2016   Procedure: TRANSESOPHAGEAL ECHOCARDIOGRAM (TEE);  Surgeon: Thurmon FairMihai Javonne Dorko, MD;  [facility];  Service: Cardiovascular;  Laterality: N/A;    Current Medications: Outpatient Medications Prior to Visit  Medication Sig Dispense Refill  . carvedilol (COREG) 6.25 MG tablet Take 1 tablet (6.25 mg total) by mouth 2 (two) times daily. 60 tablet 11  . diltiazem (CARDIZEM CD) 240 MG 24 hr capsule Take 1 capsule (240 mg total) by mouth daily. 30 capsule 0  . ondansetron (ZOFRAN ODT) 8 MG disintegrating tablet Take 1 tablet (8 mg total) by mouth every 8 (eight) hours as needed for nausea or vomiting. (Patient not taking: Reported on 04/17/2016) 10 tablet 0   No facility-administered medications prior to visit.      Allergies:   Review of patient's allergies indicates no known allergies.   Social History   Social History  . Marital status: Single    Spouse name: N/A  . Number of children: N/A  . Years of education: N/A   Occupational History  . 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 Smoking status: Never Smoker  . Smokeless tobacco: Never Used  . Alcohol use No  . Drug use: No  . Sexual activity: Not Asked   Other Topics Concern  . None   Social History Narrative   Pt lives alone in [place]. His stepfather died of an MI, but his biological father and his mother have no known CAD.     Family History:  The  patient's family history is not on file.   ROS:   Please see the history of present illness.    ROS All other systems reviewed and are negative.   PHYSICAL EXAM:   VS:  BP 102/65 (BP Location: Left Arm, Patient Position: Sitting, Cuff Size: Normal)   Pulse (!) 57   Ht 5\' 11"  (1.803 m)   Wt 213 lb 6.4 oz (96.8 kg)   SpO2 97%   BMI 29.76 kg/m    GEN: Well nourished, well  developed, in no acute distress  HEENT: normal  Neck: no JVD, carotid bruits, or masses Cardiac: irregular; no murmurs, rubs, or gallops,no edema  Respiratory:  clear to auscultation bilaterally, normal work of breathing GI: soft, nontender, nondistended, + BS MS: no deformity or atrophy  Skin: warm and dry, no rash Neuro:  Alert and Oriented x 3, Strength and sensation are intact Psych: euthymic mood, full affect  Wt Readings from Last 3 Encounters:  04/17/16 213 lb 6.4 oz (96.8 kg)  04/07/16 213 lb (96.6 kg)  04/08/16 221 lb (100.2 kg)      Studies/Labs Reviewed:   EKG:  EKG is ordered today.  The ekg ordered today demonstrates Atrial fibrillation with controlled ventricular rate, borderline bradycardic; inverted T waves in leads V5-V6 Ammann probably secondary to left ventricular hypertrophy  Recent Labs: 03/19/2016: ALT 60 03/20/2016: B Natriuretic Peptide 657.5; Magnesium 2.4; TSH 1.688 03/21/2016: Hemoglobin 12.7; Platelets 226 04/16/2016: BUN 24; Creat 1.59; Potassium 4.6; Sodium 137   Lipid Panel    Component Value Date/Time   CHOL 184 03/20/2016 0404   TRIG 88 03/20/2016 0404   HDL 32 (L) 03/20/2016 0404   CHOLHDL 5.8 03/20/2016 0404   VLDL 18 03/20/2016 0404   LDLCALC 134 (H) 03/20/2016 0404    ASSESSMENT:    1. Persistent atrial fibrillation (HCC)   2. Essential hypertension   3. CKD (chronic kidney disease) stage 2, GFR 60-89 ml/min   4. Elevated troponin   5. Pre-procedural laboratory examination      PLAN:  In order of problems listed above:  1. AFib: Rate is well controlled on lower dose carvedilol to 6.25 mg twice a day. I still think that at his young age it is worth trying to restore normal rhythm.Since he is so young, he may do best with radiofrequency ablation. Will refer to electrophysiology to discuss this option, in the meantime will start flecainide and give cardioversion a second attempt week. I would recommend that he remain on anticoagulation  for the time being, although it appears that his embolic risk is low (CHADSVasc 1). 2. HTN: Well controlled despite reduced dose beta blocker. 3. CKD: Continues to improve very slowly.  Suspect transient ATN (due to hypovolemia) on background of chronic abnormality, presumably secondary to hypertensive nephrosclerosis. Urinnalysis is now quiet.  4. Abnormal troponin:. Suspect this was related to acute tachyarrhythmia. Nuclear stress test normal. Neve had angina.   Medication Adjustments/Labs and Tests Ordered: Current medicines are reviewed at length with the patient today.  Concerns regarding medicines are outlined above.  Medication changes, Labs and Tests ordered today are listed in the Patient Instructions below. Patient Instructions  Medication Instructions: Dr Royann Shivers has recommended making the following medication changes: 1. START Flecainide 50 mg - take 1 tablet by mouth twice daily  Labwork: Your physician recommends that you return for lab work prior to cardioversion. The scheduler will let you know when to have blood work completed  Testing/Procedures: 1. Cardioversion on Wednesday, 04/23/16  with Dr Duke Salviaandolph - Your physician has recommended that you have a Cardioversion (DCCV). Electrical Cardioversion uses a jolt of electricity to your heart either through paddles or wired patches attached to your chest. This is a controlled, usually prescheduled, procedure. Defibrillation is done under light anesthesia in the hospital, and you usually go home the day of the procedure. This is done to get your heart back into a normal rhythm. You are not awake for the procedure. Please see the instruction sheet given to you today.  2. EKG - Please schedule a nurse visit to have an EKG prior to cardioversion on Tuesday, 04/22/16 at 8:00a.  Follow-up: Dr Royann Shiversroitoru recommends that you schedule a follow-up appointment in 1 month.  If you need a refill on your cardiac medications before your next  appointment, please call your pharmacy.   Dr Royann Shiversroitoru has referred you to an electrophysiologist, Dr Elberta Fortisamnitz, for consideration of atrial fibrillation ablation.    Signed, Thurmon FairMihai Tyrese Ficek, MD  04/17/2016 11:14 AM    [facility] [address], [place], [place]  [address] Phone: [phone]; Fax: [phone]

## 2016-04-17 NOTE — Patient Instructions (Signed)
Medication Instructions: Dr Royann Shiversroitoru has recommended making the following medication changes: 1. START Flecainide 50 mg - take 1 tablet by mouth twice daily  Labwork: Your physician recommends that you return for lab work prior to cardioversion. The scheduler will let you know when to have blood work completed  Testing/Procedures: 1. Cardioversion on Wednesday, 04/23/16 with Dr Duke Salviaandolph - Your physician has recommended that you have a Cardioversion (DCCV). Electrical Cardioversion uses a jolt of electricity to your heart either through paddles or wired patches attached to your chest. This is a controlled, usually prescheduled, procedure. Defibrillation is done under light anesthesia in the hospital, and you usually go home the day of the procedure. This is done to get your heart back into a normal rhythm. You are not awake for the procedure. Please see the instruction sheet given to you today.  2. EKG - Please schedule a nurse visit to have an EKG prior to cardioversion on Tuesday, 04/22/16 at 8:00a.  Follow-up: Dr Royann Shiversroitoru recommends that you schedule a follow-up appointment in 1 month.  If you need a refill on your cardiac medications before your next appointment, please call your pharmacy.   Dr Royann Shiversroitoru has referred you to an electrophysiologist, Dr Elberta Fortisamnitz, for consideration of atrial fibrillation ablation.

## 2016-04-20 ENCOUNTER — Other Ambulatory Visit: Payer: Self-pay | Admitting: Cardiovascular Disease

## 2016-04-21 NOTE — Telephone Encounter (Signed)
[facility] states [facility] not [facility] his Xarelto or diltiazem until we call

## 2016-04-21 NOTE — Telephone Encounter (Signed)
Pt calling to get refill of Xarelto asap, he is out as of yesterday, and not suppose to miss a dose.

## 2016-04-21 NOTE — Telephone Encounter (Signed)
rx sent to pharmacy

## 2016-04-22 ENCOUNTER — Other Ambulatory Visit: Payer: Self-pay | Admitting: Cardiovascular Disease

## 2016-04-22 ENCOUNTER — Ambulatory Visit (INDEPENDENT_AMBULATORY_CARE_PROVIDER_SITE_OTHER): Payer: BLUE CROSS/BLUE SHIELD | Admitting: *Deleted

## 2016-04-22 VITALS — HR 48

## 2016-04-22 DIAGNOSIS — I4891 Unspecified atrial fibrillation: Secondary | ICD-10-CM | POA: Diagnosis not present

## 2016-04-22 DIAGNOSIS — R9431 Abnormal electrocardiogram [ECG] [EKG]: Secondary | ICD-10-CM

## 2016-04-22 DIAGNOSIS — Z01812 Encounter for preprocedural laboratory examination: Secondary | ICD-10-CM | POA: Diagnosis not present

## 2016-04-22 DIAGNOSIS — I4819 Other persistent atrial fibrillation: Secondary | ICD-10-CM

## 2016-04-22 DIAGNOSIS — I481 Persistent atrial fibrillation: Secondary | ICD-10-CM | POA: Diagnosis not present

## 2016-04-22 LAB — CBC
HEMATOCRIT: 41.9 % (ref 38.5–50.0)
HEMOGLOBIN: 14 g/dL (ref 13.2–17.1)
MCH: 29.4 pg (ref 27.0–33.0)
MCHC: 33.4 g/dL (ref 32.0–36.0)
MCV: 88 fL (ref 80.0–100.0)
MPV: 12.2 fL (ref 7.5–12.5)
Platelets: 205 10*3/uL (ref 140–400)
RBC: 4.76 MIL/uL (ref 4.20–5.80)
RDW: 14.6 % (ref 11.0–15.0)
WBC: 5.3 10*3/uL (ref 3.8–10.8)

## 2016-04-22 NOTE — Progress Notes (Signed)
Patient came for precardioversion EKG visit, was confirmed in atrial fibrillation by physician interpretation of 12-lead strip - Dr. Rennis GoldenHilty signed.

## 2016-04-22 NOTE — Patient Instructions (Signed)
Keep cardioversion as scheduled. Call if you have any questions prior to or following the procedure.

## 2016-04-23 ENCOUNTER — Encounter (HOSPITAL_COMMUNITY)
Admission: RE | Disposition: A | Discharge status: Home / Self Care | Payer: Self-pay | Source: Ambulatory Visit | Attending: Cardiovascular Disease

## 2016-04-23 ENCOUNTER — Ambulatory Visit (HOSPITAL_COMMUNITY)
Admission: RE | Admit: 2016-04-23 | Discharge: 2016-04-23 | Disposition: A | Discharge status: Home / Self Care | Payer: BLUE CROSS/BLUE SHIELD | Source: Ambulatory Visit | Attending: Cardiovascular Disease | Admitting: Cardiovascular Disease

## 2016-04-23 ENCOUNTER — Encounter (HOSPITAL_COMMUNITY): Payer: Self-pay | Admitting: Cardiovascular Disease

## 2016-04-23 ENCOUNTER — Telehealth: Payer: Self-pay | Admitting: Cardiovascular Disease

## 2016-04-23 ENCOUNTER — Ambulatory Visit (HOSPITAL_COMMUNITY): Payer: BLUE CROSS/BLUE SHIELD | Admitting: Anesthesiology

## 2016-04-23 DIAGNOSIS — Z7901 Long term (current) use of anticoagulants: Secondary | ICD-10-CM | POA: Diagnosis not present

## 2016-04-23 DIAGNOSIS — R748 Abnormal levels of other serum enzymes: Secondary | ICD-10-CM | POA: Diagnosis not present

## 2016-04-23 DIAGNOSIS — I481 Persistent atrial fibrillation: Secondary | ICD-10-CM | POA: Diagnosis not present

## 2016-04-23 DIAGNOSIS — I129 Hypertensive chronic kidney disease with stage 1 through stage 4 chronic kidney disease, or unspecified chronic kidney disease: Secondary | ICD-10-CM | POA: Diagnosis not present

## 2016-04-23 DIAGNOSIS — I4891 Unspecified atrial fibrillation: Secondary | ICD-10-CM | POA: Diagnosis not present

## 2016-04-23 DIAGNOSIS — Z79899 Other long term (current) drug therapy: Secondary | ICD-10-CM | POA: Insufficient documentation

## 2016-04-23 DIAGNOSIS — N182 Chronic kidney disease, stage 2 (mild): Secondary | ICD-10-CM | POA: Insufficient documentation

## 2016-04-23 DIAGNOSIS — E876 Hypokalemia: Secondary | ICD-10-CM | POA: Diagnosis not present

## 2016-04-23 DIAGNOSIS — I4819 Other persistent atrial fibrillation: Secondary | ICD-10-CM

## 2016-04-23 HISTORY — PX: CARDIOVERSION: SHX1299

## 2016-04-23 LAB — BASIC METABOLIC PANEL
BUN: 25 mg/dL (ref 7–25)
CHLORIDE: 106 mmol/L (ref 98–110)
CO2: 23 mmol/L (ref 20–31)
Calcium: 9.7 mg/dL (ref 8.6–10.3)
Creat: 1.78 mg/dL — ABNORMAL HIGH (ref 0.60–1.35)
GLUCOSE: 90 mg/dL (ref 65–99)
POTASSIUM: 4.9 mmol/L (ref 3.5–5.3)
Sodium: 139 mmol/L (ref 135–146)

## 2016-04-23 LAB — PROTIME-INR
INR: 1.4 — AB
Prothrombin Time: 14.5 s — ABNORMAL HIGH (ref 9.0–11.5)

## 2016-04-23 LAB — APTT: aPTT: 34 s (ref 22–34)

## 2016-04-23 LAB — TSH: TSH: 1.42 mIU/L (ref 0.40–4.50)

## 2016-04-23 SURGERY — CARDIOVERSION
Anesthesia: Monitor Anesthesia Care

## 2016-04-23 MED ORDER — SODIUM CHLORIDE 0.9 % IV SOLN
INTRAVENOUS | Status: DC
  Administered 2016-04-23: 13:00:00 via INTRAVENOUS

## 2016-04-23 MED ORDER — LIDOCAINE 2% (20 MG/ML) 5 ML SYRINGE
INTRAMUSCULAR | Status: DC | PRN
  Administered 2016-04-23: 100 mg via INTRAVENOUS

## 2016-04-23 MED ORDER — PROPOFOL 10 MG/ML IV BOLUS
INTRAVENOUS | Status: DC | PRN
  Administered 2016-04-23: 90 mg via INTRAVENOUS

## 2016-04-23 NOTE — Interval H&P Note (Signed)
History and Physical Interval Note:  04/23/2016 8:25 AM  Mark Ponce  has presented today for surgery, with the diagnosis of AFIB  The various methods of treatment have been discussed with the patient and family. After consideration of risks, benefits and other options for treatment, the patient has consented to  Procedure(s): CARDIOVERSION (N/A) as a surgical intervention .  The patient's history has been reviewed, patient examined, no change in status, stable for surgery.  I have reviewed the patient's chart and labs.  Questions were answered to the patient's satisfaction.     Chilton Siiffany [place], MD

## 2016-04-23 NOTE — Discharge Instructions (Signed)
Electrical Cardioversion, Care After °Refer to this sheet in the next few weeks. These instructions provide you with information on caring for yourself after your procedure. Your health care provider may also give you more specific instructions. Your treatment has been planned according to current medical practices, but problems sometimes occur. Call your health care provider if you have any problems or questions after your procedure. °WHAT TO EXPECT AFTER THE PROCEDURE °After your procedure, it is typical to have the following sensations: °· Some redness on the skin where the shocks were delivered. If this is tender, a sunburn lotion or hydrocortisone cream may help. °· Possible return of an abnormal heart rhythm within hours or days after the procedure. °HOME CARE INSTRUCTIONS °· Take medicines only as directed by your health care provider. Be sure you understand how and when to take your medicine. °· Learn how to feel your pulse and check it often. °· Limit your activity for 48 hours after the procedure or as directed by your health care provider. °· Avoid or minimize caffeine and other stimulants as directed by your health care provider. °SEEK MEDICAL CARE IF: °· You feel like your heart is beating too fast or your pulse is not regular. °· You have any questions about your medicines. °· You have bleeding that will not stop. °SEEK IMMEDIATE MEDICAL CARE IF: °· You are dizzy or feel faint. °· It is hard to breathe or you feel short of breath. °· There is a change in discomfort in your chest. °· Your speech is slurred or you have trouble moving an arm or leg on one side of your body. °· You get a serious muscle cramp that does not go away. °· Your fingers or toes turn cold or blue. °  °This information is not intended to replace advice given to you by your health care provider. Make sure you discuss any questions you have with your health care provider. °  °Document Released: 06/01/2013 Document Revised: 09/01/2014  Document Reviewed: 06/01/2013 °Elsevier Interactive Patient Education ©2016 Elsevier Inc. ° °

## 2016-04-23 NOTE — Anesthesia Postprocedure Evaluation (Signed)
Anesthesia Post Note  Patient: Mark Ponce  Procedure(s) Performed: Procedure(s) (LRB): CARDIOVERSION (N/A)  Patient location during evaluation: PACU Anesthesia Type: MAC Level of consciousness: awake and alert Pain management: pain level controlled Vital Signs Assessment: post-procedure vital signs reviewed and stable Respiratory status: spontaneous breathing, nonlabored ventilation, respiratory function stable and patient connected to nasal cannula oxygen Cardiovascular status: stable and blood pressure returned to baseline Anesthetic complications: no    Last Vitals:  Vitals:   04/23/16 1320 04/23/16 1330  BP: (!) 144/77 125/89  Pulse: 65 (!) 51  Resp: 19 16  Temp:      Last Pain:  Vitals:   04/23/16 1314  TempSrc: Oral                 Eustolia Drennen S

## 2016-04-23 NOTE — Telephone Encounter (Signed)
New Message  Pt call requesting to speak with RN about taking his medication before his procedure today 8/30. Please call back to discuss

## 2016-04-23 NOTE — Anesthesia Preprocedure Evaluation (Signed)
Anesthesia Evaluation  Patient identified by MRN, date of birth, ID band Patient awake    Reviewed: Allergy & Precautions, NPO status , Patient's Chart, lab work & pertinent test results  Airway Mallampati: II  TM Distance: >3 FB Neck ROM: Full    Dental no notable dental hx.    Pulmonary neg pulmonary ROS,    Pulmonary exam normal breath sounds clear to auscultation       Cardiovascular hypertension, Normal cardiovascular exam+ dysrhythmias Atrial Fibrillation  Rhythm:Regular Rate:Normal     Neuro/Psych negative neurological ROS  negative psych ROS   GI/Hepatic negative GI ROS, Neg liver ROS,   Endo/Other  negative endocrine ROS  Renal/GU Renal InsufficiencyRenal disease  negative genitourinary   Musculoskeletal negative musculoskeletal ROS (+)   Abdominal   Peds negative pediatric ROS (+)  Hematology negative hematology ROS (+)   Anesthesia Other Findings   Reproductive/Obstetrics negative OB ROS                             Anesthesia Physical Anesthesia Plan  ASA: III  Anesthesia Plan: MAC   Post-op Pain Management:    Induction: Intravenous  Airway Management Planned: Nasal Cannula  Additional Equipment:   Intra-op Plan:   Post-operative Plan:   Informed Consent: I have reviewed the patients History and Physical, chart, labs and discussed the procedure including the risks, benefits and alternatives for the proposed anesthesia with the patient or authorized representative who has indicated his/her understanding and acceptance.   Dental advisory given  Plan Discussed with: CRNA and Surgeon  Anesthesia Plan Comments:         Anesthesia Quick Evaluation

## 2016-04-23 NOTE — CV Procedure (Signed)
Electrical Cardioversion Procedure Note Mark MinaWilliam C Ponce [id number] 09/26/1970  Procedure: Electrical Cardioversion Indications:  Atrial Fibrillation  Procedure Details Consent: Risks of procedure as well as the alternatives and risks of each were explained to the (patient/caregiver).  Consent for procedure obtained. Time Out: Verified patient identification, verified procedure, site/side was marked, verified correct patient position, special equipment/implants available, medications/allergies/relevent history reviewed, required imaging and test results available.  Performed  Patient placed on cardiac monitor, pulse oximetry, supplemental oxygen as necessary.  Sedation given: Propofol via Anesthesia Pacer pads placed anterior and posterior chest.  Cardioverted 1 time(s).  Cardioverted at 200J.  Evaluation Findings: Post procedure EKG shows: NSR Complications: None Patient did tolerate procedure well.   Mark Siiffany [place], MD 04/23/2016, 1:14 PM

## 2016-04-23 NOTE — Telephone Encounter (Signed)
Returned call to patient-pt wanted to verify what medications to take this AM prior to DCCV today.  Pt instructed to take all medications as normally would with small amt of water (pt not on diabetic medication or insulin).  Pt verbalized understanding.

## 2016-04-23 NOTE — Transfer of Care (Signed)
Immediate Anesthesia Transfer of Care Note  Patient: Mark Ponce  Procedure(s) Performed: Procedure(s): CARDIOVERSION (N/A)  Patient Location: PACU  Anesthesia Type:MAC  Level of Consciousness: patient cooperative and responds to stimulation  Airway & Oxygen Therapy: Patient Spontanous Breathing and Patient connected to nasal cannula oxygen  Post-op Assessment: Report given to RN and Post -op Vital signs reviewed and stable  Post vital signs: Reviewed and stable  Last Vitals:  Vitals:   04/23/16 1156  BP: (!) 164/95  Pulse: 60  Resp: 18  Temp: 36.8 C    Last Pain:  Vitals:   04/23/16 1156  TempSrc: Oral         Complications: No apparent anesthesia complications

## 2016-04-23 NOTE — H&P (View-Only) (Signed)
 Cardiology Office Note    Date:  04/17/2016   ID:  Gerrick C Grigoryan, DOB 02/11/1971, MRN [id number]  PCP:  MILLER,LISA LYNN, MD  Cardiologist:   Cooper Moroney, MD   chief complaint: Atrial fibrillation follow-up   History of Present Illness:  Mark Ponce is a 45 y.o. male with recently diagnosed persistent atrial fibrillation And early recurrence after initial attempt at elective cardioversion, now returns for follow-up and is still in atrial fibrillation. Rate control remains excellent even after we reduced his beta blocker dose. He seems to be essentially asymptomatic  He denies angina or dyspnea either at rest or with activity. He has not had problems with edema. He denies focal neurological events. He has not had any bleeding on Xarelto.  His echocardiogram demonstrated mild left ventricular hypertrophy and mild left atrial dilatation. There was normal left ventricular systolic function and there were no regional wall motion abnormalities. His nuclear stress test is normal. He did have very slight increase in cardiac troponin I after his initial presentation with atrial fibrillation rapid ventricular response. Onset of illness was associated with GI upset with severe nausea and vomiting.  He has also been found to have persistently abnormal renal function tests, suggesting of chronic kidney disease stage II. These have been slowly improving from their worst level, suggesting he had a component of acute tubular necrosis related to nausea/vomiting and hypovolemia. Creatinine remains elevated at roughly 1.5.   Overall the impression is of long-standing untreated systemic hypertension. Blood pressure is now normal.  Past Medical History:  Diagnosis Date  . AKI (acute kidney injury) (HCC) 02/2016  . Hypokalemia 02/2016  . New onset atrial fibrillation (HCC) 02/2016    Past Surgical History:  Procedure Laterality Date  . CARDIOVERSION N/A 03/24/2016   Procedure: CARDIOVERSION;   Surgeon: Freeman Borba, MD;  Location: [facility];  Service: Cardiovascular;  Laterality: N/A;  . NO PAST SURGERIES    . TEE WITHOUT CARDIOVERSION N/A 03/24/2016   Procedure: TRANSESOPHAGEAL ECHOCARDIOGRAM (TEE);  Surgeon: Joan Herschberger, MD;  Location: [facility];  Service: Cardiovascular;  Laterality: N/A;    Current Medications: Outpatient Medications Prior to Visit  Medication Sig Dispense Refill  . carvedilol (COREG) 6.25 MG tablet Take 1 tablet (6.25 mg total) by mouth 2 (two) times daily. 60 tablet 11  . diltiazem (CARDIZEM CD) 240 MG 24 hr capsule Take 1 capsule (240 mg total) by mouth daily. 30 capsule 0  . ondansetron (ZOFRAN ODT) 8 MG disintegrating tablet Take 1 tablet (8 mg total) by mouth every 8 (eight) hours as needed for nausea or vomiting. (Patient not taking: Reported on 04/17/2016) 10 tablet 0   No facility-administered medications prior to visit.      Allergies:   Review of patient's allergies indicates no known allergies.   Social History   Social History  . Marital status: Single    Spouse name: N/A  . Number of children: N/A  . Years of education: N/A   Occupational History  . Store Manager [employer]    Social History Main Topics  . Smoking status: Never Smoker  . Smokeless tobacco: Never Used  . Alcohol use No  . Drug use: No  . Sexual activity: Not Asked   Other Topics Concern  . None   Social History Narrative   Pt lives alone in [place]. His stepfather died of an MI, but his biological father and his mother have no known CAD.     Family History:  The   patient's family history is not on file.   ROS:   Please see the history of present illness.    ROS All other systems reviewed and are negative.   PHYSICAL EXAM:   VS:  BP 102/65 (BP Location: Left Arm, Patient Position: Sitting, Cuff Size: Normal)   Pulse (!) 57   Ht 5' 11" (1.803 m)   Wt 213 lb 6.4 oz (96.8 kg)   SpO2 97%   BMI 29.76 kg/m    GEN: Well nourished, well  developed, in no acute distress  HEENT: normal  Neck: no JVD, carotid bruits, or masses Cardiac: irregular; no murmurs, rubs, or gallops,no edema  Respiratory:  clear to auscultation bilaterally, normal work of breathing GI: soft, nontender, nondistended, + BS MS: no deformity or atrophy  Skin: warm and dry, no rash Neuro:  Alert and Oriented x 3, Strength and sensation are intact Psych: euthymic mood, full affect  Wt Readings from Last 3 Encounters:  04/17/16 213 lb 6.4 oz (96.8 kg)  04/07/16 213 lb (96.6 kg)  04/08/16 221 lb (100.2 kg)      Studies/Labs Reviewed:   EKG:  EKG is ordered today.  The ekg ordered today demonstrates Atrial fibrillation with controlled ventricular rate, borderline bradycardic; inverted T waves in leads V5-V6 Ammann probably secondary to left ventricular hypertrophy  Recent Labs: 03/19/2016: ALT 60 03/20/2016: B Natriuretic Peptide 657.5; Magnesium 2.4; TSH 1.688 03/21/2016: Hemoglobin 12.7; Platelets 226 04/16/2016: BUN 24; Creat 1.59; Potassium 4.6; Sodium 137   Lipid Panel    Component Value Date/Time   CHOL 184 03/20/2016 0404   TRIG 88 03/20/2016 0404   HDL 32 (L) 03/20/2016 0404   CHOLHDL 5.8 03/20/2016 0404   VLDL 18 03/20/2016 0404   LDLCALC 134 (H) 03/20/2016 0404    ASSESSMENT:    1. Persistent atrial fibrillation (HCC)   2. Essential hypertension   3. CKD (chronic kidney disease) stage 2, GFR 60-89 ml/min   4. Elevated troponin   5. Pre-procedural laboratory examination      PLAN:  In order of problems listed above:  1. AFib: Rate is well controlled on lower dose carvedilol to 6.25 mg twice a day. I still think that at his young age it is worth trying to restore normal rhythm.Since he is so young, he may do best with radiofrequency ablation. Will refer to electrophysiology to discuss this option, in the meantime will start flecainide and give cardioversion a second attempt week. I would recommend that he remain on anticoagulation  for the time being, although it appears that his embolic risk is low (CHADSVasc 1). 2. HTN: Well controlled despite reduced dose beta blocker. 3. CKD: Continues to improve very slowly.  Suspect transient ATN (due to hypovolemia) on background of chronic abnormality, presumably secondary to hypertensive nephrosclerosis. Urinnalysis is now quiet.  4. Abnormal troponin:. Suspect this was related to acute tachyarrhythmia. Nuclear stress test normal. Neve had angina.   Medication Adjustments/Labs and Tests Ordered: Current medicines are reviewed at length with the patient today.  Concerns regarding medicines are outlined above.  Medication changes, Labs and Tests ordered today are listed in the Patient Instructions below. Patient Instructions  Medication Instructions: Dr Gaspard Isbell has recommended making the following medication changes: 1. START Flecainide 50 mg - take 1 tablet by mouth twice daily  Labwork: Your physician recommends that you return for lab work prior to cardioversion. The scheduler will let you know when to have blood work completed  Testing/Procedures: 1. Cardioversion on Wednesday, 04/23/16   with Dr Yale - Your physician has recommended that you have a Cardioversion (DCCV). Electrical Cardioversion uses a jolt of electricity to your heart either through paddles or wired patches attached to your chest. This is a controlled, usually prescheduled, procedure. Defibrillation is done under light anesthesia in the hospital, and you usually go home the day of the procedure. This is done to get your heart back into a normal rhythm. You are not awake for the procedure. Please see the instruction sheet given to you today.  2. EKG - Please schedule a nurse visit to have an EKG prior to cardioversion on Tuesday, 04/22/16 at 8:00a.  Follow-up: Dr Saba Neuman recommends that you schedule a follow-up appointment in 1 month.  If you need a refill on your cardiac medications before your next  appointment, please call your pharmacy.   Dr Breta Demedeiros has referred you to an electrophysiologist, Dr Camnitz, for consideration of atrial fibrillation ablation.    Signed, Jemery Stacey, MD  04/17/2016 11:14 AM    [facility] [address], [place], [place]  [address] Phone: [phone]; Fax: [phone]   

## 2016-04-25 ENCOUNTER — Encounter: Payer: Self-pay | Admitting: Cardiology

## 2016-04-29 ENCOUNTER — Ambulatory Visit (INDEPENDENT_AMBULATORY_CARE_PROVIDER_SITE_OTHER): Payer: BLUE CROSS/BLUE SHIELD | Admitting: Cardiology

## 2016-04-29 ENCOUNTER — Encounter (INDEPENDENT_AMBULATORY_CARE_PROVIDER_SITE_OTHER): Payer: Self-pay

## 2016-04-29 ENCOUNTER — Encounter: Payer: Self-pay | Admitting: Cardiology

## 2016-04-29 VITALS — BP 122/80 | HR 57 | Ht 71.0 in | Wt 211.2 lb

## 2016-04-29 DIAGNOSIS — I4819 Other persistent atrial fibrillation: Secondary | ICD-10-CM

## 2016-04-29 DIAGNOSIS — I481 Persistent atrial fibrillation: Secondary | ICD-10-CM | POA: Diagnosis not present

## 2016-04-29 NOTE — Progress Notes (Signed)
Electrophysiology Office Note   Date:  04/29/2016   ID:  Mark Ponce, DOB [birth year], MRN [id number]  PCP:  Mark Labella, MD  Cardiologist:  Mark Ponce Primary Electrophysiologist:  Mark Ponce Mark Loa, MD    Chief Complaint  Patient presents with  . New Patient (Initial Visit)    discuss afib ablation     History of Present Illness: Mark Ponce is a 45 y.o. male who presents today for electrophysiology evaluation.   Diagnosed persistent atrial fibrillation And early recurrence after initial attempt at elective cardioversion. He was subsequently placed on 50 mg of flecainide with repeat cardioversion. He has maintained normal rhythm. He says that he could tell after his first cardioversion when he went back out of rhythm. His symptoms were fatigue and weakness, as well as occasional palpitations. After a second cardioversion, he returned to sinus rhythm and has not had any symptoms consistent with his atrial fibrillation.   Today, he denies symptoms of palpitations, chest pain, shortness of breath, orthopnea, PND, lower extremity edema, claudication, dizziness, presyncope, syncope, bleeding, or neurologic sequela. The patient is tolerating medications without difficulties and is otherwise without complaint today.    Past Medical History:  Diagnosis Date  . AKI (acute kidney injury) (HCC) 02/2016  . Hypokalemia 02/2016  . New onset atrial fibrillation (HCC) 02/2016   Past Surgical History:  Procedure Laterality Date  . CARDIOVERSION N/A 03/24/2016   Procedure: CARDIOVERSION;  Surgeon: Mark Fair, MD;  Location: [facility];  Service: Cardiovascular;  Laterality: N/A;  . CARDIOVERSION N/A 04/23/2016   Procedure: CARDIOVERSION;  Surgeon: Mark Si, MD;  Location: [facility] ENDOSCOPY;  Service: Cardiovascular;  Laterality: N/A;  . NO PAST SURGERIES    . TEE WITHOUT CARDIOVERSION N/A 03/24/2016   Procedure: TRANSESOPHAGEAL ECHOCARDIOGRAM (TEE);  Surgeon: Mark Fair,  MD;  Location: [facility] ENDOSCOPY;  Service: Cardiovascular;  Laterality: N/A;     Current Outpatient Prescriptions  Medication Sig Dispense Refill  . carvedilol (COREG) 6.25 MG tablet Take 1 tablet (6.25 mg total) by mouth 2 (two) times daily. 60 tablet 11  . diltiazem (CARDIZEM CD) 240 MG 24 hr capsule TAKE 1 CAPSULE (240 MG TOTAL) BY MOUTH DAILY. 30 capsule 0  . flecainide (TAMBOCOR) 50 MG tablet Take 1 tablet (50 mg total) by mouth 2 (two) times daily. 60 tablet 11  . rivaroxaban (XARELTO) 10 MG TABS tablet Take 10 mg by mouth daily.     No current facility-administered medications for this visit.     Allergies:   Review of patient's allergies indicates no known allergies.   Social History:  The patient  reports that he has never smoked. He has never used smokeless tobacco. He reports that he does not drink alcohol or use drugs.   Family History:  The patient's parents and siblings are healthy without issue.  ROS:  Please see the history of present illness.   Otherwise, review of systems is positive for none.   All other systems are reviewed and negative.    PHYSICAL EXAM: VS:  BP 122/80   Pulse (!) 57   Ht 5\' 11"  (1.803 m)   Wt 211 lb 3.2 oz (95.8 kg)   BMI 29.46 kg/m  , BMI Body mass index is 29.46 kg/m. GEN: Well nourished, well developed, in no acute distress  HEENT: normal  Neck: no JVD, carotid bruits, or masses Cardiac: RRR; no murmurs, rubs, or gallops,no edema  Respiratory:  clear to auscultation bilaterally, normal work of breathing GI: soft, nontender, nondistended, +  BS MS: no deformity or atrophy  Skin: warm and dry Neuro:  Strength and sensation are intact Psych: euthymic mood, full affect  EKG:  EKG is ordered today. Personal review of the ekg ordered shows sinus rhythm, rate 57, lateral T wave inversion  Recent Labs: 03/19/2016: ALT 60 03/20/2016: B Natriuretic Peptide 657.5; Magnesium 2.4 04/22/2016: BUN 25; Creat 1.78; Hemoglobin 14.0; Platelets 205;  Potassium 4.9; Sodium 139; TSH 1.42    Lipid Panel     Component Value Date/Time   CHOL 184 03/20/2016 0404   TRIG 88 03/20/2016 0404   HDL 32 (L) 03/20/2016 0404   CHOLHDL 5.8 03/20/2016 0404   VLDL 18 03/20/2016 0404   LDLCALC 134 (H) 03/20/2016 0404     Wt Readings from Last 3 Encounters:  04/29/16 211 lb 3.2 oz (95.8 kg)  04/17/16 213 lb 6.4 oz (96.8 kg)  04/07/16 213 lb (96.6 kg)      Other studies Reviewed: Additional studies/ records that were reviewed today include: TTE 03/20/16, SPECT 03/31/16  Review of the above records today demonstrates:  - Left ventricle: The cavity size was normal. Wall thickness was   increased in a pattern of mild LVH. Systolic function was normal.   The estimated ejection fraction was in the range of 55% to 60%.   Wall motion was normal; there were no regional wall motion   abnormalities. The study is not technically sufficient to allow   evaluation of LV diastolic function. - Aorta: Aortic root dimension: 39 mm (ED). - Ascending aorta: The ascending aorta was mildly dilated. - Mitral valve: There was mild regurgitation. - Left atrium: The atrium was mildly dilated. Volume/bsa, ES,   (1-plane Simpson&'s, A2C): 36.2 ml/m^2. - Right atrium: The atrium was mildly dilated.   There was no ST segment deviation noted during stress.  This is a low risk study. No perfusion defects, no ischemia. Non-gated images secondary to atrial fibrillation. No ejection fraction calculated.   ASSESSMENT AND PLAN:  1.  Persistent atrial fibrillation: Currently in sinus rhythm on flecainide, Coreg, diltiazem, and Xarelto. His CHADS2VASC is 1 and therefore he would be able to come off of anticoagulation once a decision is made regarding his long-term therapy. Of note, he says that prior to his diagnosis of atrial fibrillation, he had no evidence of hypertension, and his blood pressures at his primary physician's were consistently in the 130s. I discussed with him  the options of ablation versus medical management. We discussed the risks and benefits of ablation, including bleeding, tamponade, heart block, stroke, and damage to surrounding organs. He understands these risks. He says that he would like to think about the option of ablation. We [place] give him information to read at home.   This patients CHA2DS2-VASc Score and unadjusted Ischemic Stroke Rate (% per year) is equal to 0.6 % stroke rate/year from a score of 1  Above score calculated as 1 point each if present [CHF, HTN, DM, Vascular=MI/PAD/Aortic Plaque, Age if 65-74, or Male] Above score calculated as 2 points each if present [Age > 75, or Stroke/TIA/TE]   2. Hypertension: Currently well controlled    Current medicines are reviewed at length with the patient today.   The patient does not have concerns regarding his medicines.  The following changes were made today:  none  Labs/ tests ordered today include:  No orders of the defined types were placed in this encounter.    Disposition:   FU with Ayn Domangue 3 months  Signed,  Zemira Zehring Mark Loa, MD  04/29/2016 2:55 PM     [facility] [address] [place] [address] [phone] (office) [phone] (fax)

## 2016-04-29 NOTE — Patient Instructions (Signed)
Medication Instructions:  Your physician recommends that you continue on your current medications as directed. Please refer to the Current Medication list given to you today.   Labwork: None ordered   Testing/Procedures: None ordered   Follow-Up: Your physician recommends that you schedule a follow-up appointment in: 3 months with Dr. Elberta Fortisamnitz.    Any Other Special Instructions Will Be Listed Below (If Applicable).  Cardiac Ablation Cardiac ablation is a procedure to disable a small amount of heart tissue in very specific places. The heart has many electrical connections. Sometimes these connections are abnormal and can cause the heart to beat very fast or irregularly. By disabling some of the problem areas, heart rhythm can be improved or made normal. Ablation is done for people who:   Have Wolff-Parkinson-White syndrome.   Have other fast heart rhythms (tachycardia).   Have taken medicines for an abnormal heart rhythm (arrhythmia) that resulted in:   No success.   Side effects.   May have a high-risk heartbeat that could result in death.  LET [facility] PROVIDER KNOW ABOUT:   Any allergies you have or any previous reactions you have had to X-ray dye, food (such as seafood), medicine, or tape.   All medicines you are taking, including vitamins, herbs, eye drops, creams, and over-the-counter medicines.   Previous problems you or members of your family have had with the use of anesthetics.   Any blood disorders you have.   Previous surgeries or procedures (such as a kidney transplant) you have had.   Medical conditions you have (such as kidney failure).  RISKS AND COMPLICATIONS Generally, cardiac ablation is a safe procedure. However, problems can occur and include:   Increased risk of cancer. Depending on how long it takes to do the ablation, the dose of radiation can be high.  Bruising and bleeding where a thin, flexible tube (catheter) was inserted  during the procedure.   Bleeding into the chest, especially into the sac that surrounds the heart (serious).  Need for a permanent pacemaker if the normal electrical system is damaged.   The procedure may not be fully effective, and this may not be recognized for months. Repeat ablation procedures are sometimes required. BEFORE THE PROCEDURE   Follow any instructions from your health care provider regarding eating and drinking before the procedure.   Take your medicines as directed at regular times with water, unless instructed otherwise by your health care provider. If you are taking diabetes medicine, including insulin, ask how you are to take it and if there are any special instructions you should follow. It is common to adjust insulin dosing the day of the ablation.  PROCEDURE  An ablation is usually performed in a catheterization laboratory with the guidance of fluoroscopy. Fluoroscopy is a type of X-ray that helps your health care provider see images of your heart during the procedure.   An ablation is a minimally invasive procedure. This means a small cut (incision) is made in either your neck or groin. Your health care provider will decide where to make the incision based on your medical history and physical exam.  An IV tube will be started before the procedure begins. You will be given an anesthetic or medicine to help you relax (sedative).  The skin on your neck or groin will be numbed. A needle will be inserted into a large vein in your neck or groin and catheters will be threaded to your heart.  A special dye that shows up on  fluoroscopy pictures may be injected through the catheter. The dye helps your health care provider see the area of the heart that needs treatment.  The catheter has electrodes on the tip. When the area of heart tissue that is causing the arrhythmia is found, the catheter tip will send an electrical current to the area and "scar" the tissue. Three types  of energy can be used to ablate the heart tissue:   Heat (radiofrequency energy).   Laser energy.   Extreme cold (cryoablation).   When the area of the heart has been ablated, the catheter will be taken out. Pressure will be held on the insertion site. This will help the insertion site clot and keep it from bleeding. A bandage will be placed on the insertion site.  AFTER THE PROCEDURE   After the procedure, you will be taken to a recovery area where your vital signs (blood pressure, heart rate, and breathing) will be monitored. The insertion site will also be monitored for bleeding.   You will need to lie still for 4-6 hours. This is to ensure you do not bleed from the catheter insertion site.    This information is not intended to replace advice given to you by your health care provider. Make sure you discuss any questions you have with your health care provider.   Document Released: 12/28/2008 Document Revised: 09/01/2014 Document Reviewed: 01/03/2013 Elsevier Interactive Patient Education [employer]    If you need a refill on your cardiac medications before your next appointment, please call your pharmacy.

## 2016-05-05 ENCOUNTER — Ambulatory Visit (INDEPENDENT_AMBULATORY_CARE_PROVIDER_SITE_OTHER): Payer: BLUE CROSS/BLUE SHIELD | Admitting: Cardiovascular Disease

## 2016-05-05 ENCOUNTER — Encounter: Payer: Self-pay | Admitting: Cardiovascular Disease

## 2016-05-05 VITALS — BP 124/76 | HR 52 | Ht 71.0 in | Wt 211.4 lb

## 2016-05-05 DIAGNOSIS — N183 Chronic kidney disease, stage 3 unspecified: Secondary | ICD-10-CM

## 2016-05-05 DIAGNOSIS — E785 Hyperlipidemia, unspecified: Secondary | ICD-10-CM | POA: Insufficient documentation

## 2016-05-05 DIAGNOSIS — I4891 Unspecified atrial fibrillation: Secondary | ICD-10-CM | POA: Diagnosis not present

## 2016-05-05 DIAGNOSIS — I10 Essential (primary) hypertension: Secondary | ICD-10-CM

## 2016-05-05 DIAGNOSIS — E784 Other hyperlipidemia: Secondary | ICD-10-CM | POA: Diagnosis not present

## 2016-05-05 NOTE — Patient Instructions (Addendum)
Dr Royann Shiversroitoru recommends that you schedule a follow-up appointment in 6 weeks.  If you need a refill on your cardiac medications before your next appointment, please call your pharmacy.   You have been referred to a nephrologist, Dr Arrie Aranoladonato.

## 2016-05-05 NOTE — Progress Notes (Signed)
Cardiology Office Note    Date:  05/05/2016   ID:  Mark Ponce, DOB 03/16/1971, MRN [id number]  PCP:  Neldon LabellaMILLER,LISA LYNN, MD  Cardiologist:   Thurmon FairMihai Kourtland Coopman, MD   chief complaint: Atrial fibrillation follow-up   History of Present Illness:  Mark MinaWilliam C Ponce is a 45 y.o. male with recently diagnosed persistent atrial fibrillation and early recurrence after initial attempt at elective cardioversion, now returns for follow-up 2 weeks after another DCCV on flecainide. He is essentially asymptomatic, notes he feels better in normal rhythm.  The patient specifically denies any chest pain at rest exertion, dyspnea at rest or with exertion, orthopnea, paroxysmal nocturnal dyspnea, syncope, palpitations, focal neurological deficits, intermittent claudication, lower extremity edema, unexplained weight gain, cough, hemoptysis or wheezing. He has not had any bleeding on Xarelto.  His echocardiogram demonstrated mild left ventricular hypertrophy and mild left atrial dilatation. There was normal left ventricular systolic function and there were no regional wall motion abnormalities. His nuclear stress test is normal. He did have very slight increase in cardiac troponin I after his initial presentation with atrial fibrillation rapid ventricular response. Onset of illness was associated with GI upset with severe nausea and vomiting.  He has also been found to have persistently abnormal renal function tests, suggesting of chronic kidney disease stage II. These have been slowly improving from their worst level, suggesting he had a component of acute tubular necrosis related to nausea/vomiting and hypovolemia. Creatinine remains elevated at roughly 1.7.   Overall the impression is of long-standing untreated systemic hypertension, but he reports his BP was at most episodically elevated in the past. Blood pressure is now normal.  Past Medical History:  Diagnosis Date  . AKI (acute kidney injury) (HCC)  02/2016  . Hypokalemia 02/2016  . New onset atrial fibrillation (HCC) 02/2016    Past Surgical History:  Procedure Laterality Date  . CARDIOVERSION N/A 03/24/2016   Procedure: CARDIOVERSION;  Surgeon: Thurmon FairMihai Mescal Flinchbaugh, MD;  Location: [facility];  Service: Cardiovascular;  Laterality: N/A;  . CARDIOVERSION N/A 04/23/2016   Procedure: CARDIOVERSION;  Surgeon: Chilton Siiffany [place], MD;  [facility];  Service: Cardiovascular;  Laterality: N/A;  . NO PAST SURGERIES    . TEE WITHOUT CARDIOVERSION N/A 03/24/2016   Procedure: TRANSESOPHAGEAL ECHOCARDIOGRAM (TEE);  Surgeon: Thurmon FairMihai Ariza Evans, MD;  [facility];  Service: Cardiovascular;  Laterality: N/A;    Current Medications: Outpatient Medications Prior to Visit  Medication Sig Dispense Refill  . carvedilol (COREG) 6.25 MG tablet Take 1 tablet (6.25 mg total) by mouth 2 (two) times daily. 60 tablet 11  . diltiazem (CARDIZEM CD) 240 MG 24 hr capsule TAKE 1 CAPSULE (240 MG TOTAL) BY MOUTH DAILY. 30 capsule 0  . flecainide (TAMBOCOR) 50 MG tablet Take 1 tablet (50 mg total) by mouth 2 (two) times daily. 60 tablet 11  . rivaroxaban (XARELTO) 10 MG TABS tablet Take 10 mg by mouth daily.     No facility-administered medications prior to visit.      Allergies:   Review of patient's allergies indicates no known allergies.   Social History   Social History  . Marital status: Single    Spouse name: N/A  . Number of children: N/A  . Years of education: N/A   Occupational History  . 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 Smoking status: Never Smoker  . Smokeless tobacco: Never Used  . Alcohol use No  . Drug use: No  . Sexual activity:  Not Asked   Other Topics Concern  . None   Social History Narrative   Pt lives alone in [place]. His stepfather died of an MI, but his biological father and his mother have no known CAD.     Family History:   His stepfather died of an MI, but his biological father  and his mother have no known CAD.    ROS:   Please see the history of present illness.    ROS All other systems reviewed and are negative.   PHYSICAL EXAM:   VS:  BP 124/76   Pulse (!) 52   Ht 5\' 11"  (1.803 m)   Wt 211 lb 6.4 oz (95.9 kg)   SpO2 99%   BMI 29.48 kg/m    GEN: Well nourished, well developed, in no acute distress  HEENT: normal  Neck: no JVD, carotid bruits, or masses Cardiac: regular; no murmurs, rubs, or gallops,no edema  Respiratory:  clear to auscultation bilaterally, normal work of breathing GI: soft, nontender, nondistended, + BS MS: no deformity or atrophy  Skin: warm and dry, no rash Neuro:  Alert and Oriented x 3, Strength and sensation are intact Psych: euthymic mood, full affect  Wt Readings from Last 3 Encounters:  05/05/16 211 lb 6.4 oz (95.9 kg)  04/29/16 211 lb 3.2 oz (95.8 kg)  04/17/16 213 lb 6.4 oz (96.8 kg)      Studies/Labs Reviewed:   EKG:  EKG is not ordered today.    Recent Labs: 03/19/2016: ALT 60 03/20/2016: B Natriuretic Peptide 657.5; Magnesium 2.4 04/22/2016: BUN 25; Creat 1.78; Hemoglobin 14.0; Platelets 205; Potassium 4.9; Sodium 139; TSH 1.42   Lipid Panel    Component Value Date/Time   CHOL 184 03/20/2016 0404   TRIG 88 03/20/2016 0404   HDL 32 (L) 03/20/2016 0404   CHOLHDL 5.8 03/20/2016 0404   VLDL 18 03/20/2016 0404   LDLCALC 134 (H) 03/20/2016 0404    ASSESSMENT:    1. Chronic kidney disease, stage 3 (moderate)   2. Atrial fibrillation with RVR (HCC)   3. Essential hypertension   4. CKD (chronic kidney disease) stage 3, GFR 30-59 ml/min   5. Dyslipidemia (high LDL; low HDL)      PLAN:  In order of problems listed above:  1. AFib: Maintaining sinus rhythm and improved symptoms after cardioversion two weeks ago. Continue flecainide and carvedilol. Embolic risk is low (CHADSVasc 1), plan to DC Xarelto if no need for repeat cardioversion between now and follow up visit.. 2. HTN: Well controlled. Had  at 3. CKD: GFR around 50. May have transient ATN (due to hypovolemia) on background of chronic abnormality, presumably secondary to hypertensive nephrosclerosis. Urinalysis is now quiet. Refer to Nephrology for at least one assessment to make sure we are not missing primary kidney abnormality with secondary HTN. 4. HLP: weight loss and exercise are recommended. Recheck in several months. Borderline indication for pharmacological therapy.   Medication Adjustments/Labs and Tests Ordered: Current medicines are reviewed at length with the patient today.  Concerns regarding medicines are outlined above.  Medication changes, Labs and Tests ordered today are listed in the Patient Instructions below. Patient Instructions  Dr Royann Shivers recommends that you schedule a follow-up appointment in 6 weeks.  If you need a refill on your cardiac medications before your next appointment, please call your pharmacy.   You have been referred to a nephrologist, Dr Arrie Aran.    Signed, Thurmon Fair, MD  05/05/2016 8:30 AM    [facility] [address], [place], [place]  [address] Phone: [phone]; Fax: [phone]

## 2016-05-18 ENCOUNTER — Other Ambulatory Visit: Payer: Self-pay | Admitting: Cardiovascular Disease

## 2016-05-23 ENCOUNTER — Telehealth: Payer: Self-pay | Admitting: Cardiovascular Disease

## 2016-05-23 NOTE — Telephone Encounter (Signed)
Pt calling re stopping Diltiazem, he needs a refill and at last visit he thought he was to finish what he had then stop, he wants to verify this before getting refill

## 2016-05-23 NOTE — Telephone Encounter (Signed)
He needs to continue diltiazem. No plan to stop it anytime soon. Sorry if there was a miscommunication.

## 2016-05-23 NOTE — Telephone Encounter (Signed)
Patient voiced understanding and thanks for recommendation to continue current med therapy. He is aware to call if new concerns or questions.

## 2016-05-23 NOTE — Telephone Encounter (Signed)
Sent to Dr. Royann Shiversroitoru for verification. I don't see recommendations to d/c in last OV note.  EKG reviewed: was in A Fib w SVR on 8/29 and SB on 9/5  From Dr. Erin Hearingroitoru's notes on 9/11: AFib: Maintaining sinus rhythm and improved symptoms after cardioversion two weeks ago. Continue flecainide and carvedilol. Embolic risk is low (CHADSVasc 1), plan to DC Xarelto if no need for repeat cardioversion between now and follow up visit..Marland Kitchen

## 2016-06-06 DIAGNOSIS — M62830 Muscle spasm of back: Secondary | ICD-10-CM | POA: Diagnosis not present

## 2016-06-18 ENCOUNTER — Ambulatory Visit (INDEPENDENT_AMBULATORY_CARE_PROVIDER_SITE_OTHER): Payer: BLUE CROSS/BLUE SHIELD | Admitting: Cardiovascular Disease

## 2016-06-18 ENCOUNTER — Encounter: Payer: Self-pay | Admitting: Cardiovascular Disease

## 2016-06-18 VITALS — BP 131/79 | HR 63 | Ht 71.0 in | Wt 211.2 lb

## 2016-06-18 DIAGNOSIS — E785 Hyperlipidemia, unspecified: Secondary | ICD-10-CM

## 2016-06-18 DIAGNOSIS — N183 Chronic kidney disease, stage 3 unspecified: Secondary | ICD-10-CM

## 2016-06-18 DIAGNOSIS — E784 Other hyperlipidemia: Secondary | ICD-10-CM | POA: Diagnosis not present

## 2016-06-18 DIAGNOSIS — I481 Persistent atrial fibrillation: Secondary | ICD-10-CM

## 2016-06-18 DIAGNOSIS — I10 Essential (primary) hypertension: Secondary | ICD-10-CM

## 2016-06-18 DIAGNOSIS — I4819 Other persistent atrial fibrillation: Secondary | ICD-10-CM

## 2016-06-18 NOTE — Progress Notes (Signed)
Cardiology Office Note    Date:  06/18/2016   ID:  Mark Ponce, DOB 10/23/1970, MRN [id number]  PCP:  Neldon Labella, MD  Cardiologist:   Thurmon Fair, MD   chief complaint: Atrial fibrillation follow-up   History of Present Illness:  Mark Ponce is a 45 y.o. male with recently diagnosed persistent atrial fibrillation and early recurrence after initial attempt at elective cardioversion, now returns for follow-up 2 months after another DCCV on flecainide. He is essentially asymptomatic And has not had any palpitations since the cardioversion. He definitely feels better in sinus rhythm and is now able to distinguish the symptoms of his arrhythmia.  The patient specifically denies any chest pain at rest exertion, dyspnea at rest or with exertion, orthopnea, paroxysmal nocturnal dyspnea, syncope, palpitations, focal neurological deficits, intermittent claudication, lower extremity edema, unexplained weight gain, cough, hemoptysis or wheezing. He has not had any bleeding on Xarelto.  His echocardiogram demonstrated mild left ventricular hypertrophy and mild left atrial dilatation. There was normal left ventricular systolic function and there were no regional wall motion abnormalities. His nuclear stress test is normal. Overall the impression is of long-standing untreated systemic hypertension, but he reports his BP was at most episodically elevated in the past  He has been thinking about going ahead with radiofrequency ablation for his atrial fibrillation and has another appointment scheduled with Will Camnitz in December.  He has also been found to have persistently abnormal renal function tests, suggesting of chronic kidney disease stage II. These have been slowly improving from their worst level, suggesting he had a component of acute tubular necrosis related to nausea/vomiting and hypovolemia. Creatinine remains elevated at roughly 1.7. He has not he had heard from the nephrology  clinic about a consultation that we requested.  .   Past Medical History:  Diagnosis Date  . AKI (acute kidney injury) (HCC) 02/2016  . Hypokalemia 02/2016  . New onset atrial fibrillation (HCC) 02/2016    Past Surgical History:  Procedure Laterality Date  . CARDIOVERSION N/A 03/24/2016   Procedure: CARDIOVERSION;  Surgeon: Thurmon Fair, MD;  [facility]: [facility];  Service: Cardiovascular;  Laterality: N/A;  . CARDIOVERSION N/A 04/23/2016   Procedure: CARDIOVERSION;  Surgeon: Chilton Si, MD;  Location: [facility] ENDOSCOPY;  Service: Cardiovascular;  Laterality: N/A;  . NO PAST SURGERIES    . TEE WITHOUT CARDIOVERSION N/A 03/24/2016   Procedure: TRANSESOPHAGEAL ECHOCARDIOGRAM (TEE);  Surgeon: Thurmon Fair, MD;  [facility] END[facility]COPY;  Service: Cardiovascular;  Laterality: N/A;    Current Medications: Outpatient Medications Prior to Visit  Medication Sig Dispense Refill  . carvedilol (COREG) 6.25 MG tablet Take 1 tablet (6.25 mg total) by mouth 2 (two) times daily. 60 tablet 11  . diltiazem (CARDIZEM CD) 240 MG 24 hr capsule TAKE 1 CAPSULE (240 MG TOTAL) BY MOUTH DAILY. 30 capsule 0  . flecainide (TAMBOCOR) 50 MG tablet Take 1 tablet (50 mg total) by mouth 2 (two) times daily. 60 tablet 11  . rivaroxaban (XARELTO) 10 MG TABS tablet Take 10 mg by mouth daily.     No facility-administered medications prior to visit.      Allergies:   Review of patient's allergies indicates no known allergies.   Social History   Social History  . Marital status: Single    Spouse name: N/A  . Number of children: N/A  . 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  Smoking status: Never Smoker  . Smokeless tobacco: Never Used  . Alcohol use No  . Drug use: No  . Sexual activity: Not Asked   Other Topics Concern  . None   Social History Narrative   Pt lives alone in [place]. His stepfather died of an MI, but his  biological father and his mother have no known CAD.     Family History:   His stepfather died of an MI, but his biological father and his mother have no known CAD.    ROS:   Please see the history of present illness.    ROS All other systems reviewed and are negative.   PHYSICAL EXAM:   VS:  BP 131/79   Pulse 63   Ht 5\' 11"  (1.803 m)   Wt 211 lb 3.2 oz (95.8 kg)   BMI 29.46 kg/m    GEN: Well nourished, well developed, in no acute distress  HEENT: normal  Neck: no JVD, carotid bruits, or masses Cardiac: regular; no murmurs, rubs, or gallops,no edema  Respiratory:  clear to auscultation bilaterally, normal work of breathing GI: soft, nontender, nondistended, + BS MS: no deformity or atrophy  Skin: warm and dry, no rash Neuro:  Alert and Oriented x 3, Strength and sensation are intact Psych: euthymic mood, full affect  Wt Readings from Last 3 Encounters:  06/18/16 211 lb 3.2 oz (95.8 kg)  05/05/16 211 lb 6.4 oz (95.9 kg)  04/29/16 211 lb 3.2 oz (95.8 kg)      Studies/Labs Reviewed:   EKG:  EKG is not ordered today.    Recent Labs: 03/19/2016: ALT 60 03/20/2016: B Natriuretic Peptide 657.5; Magnesium 2.4 04/22/2016: BUN 25; Creat 1.78; Hemoglobin 14.0; Platelets 205; Potassium 4.9; Sodium 139; TSH 1.42   Lipid Panel    Component Value Date/Time   CHOL 184 03/20/2016 0404   TRIG 88 03/20/2016 0404   HDL 32 (L) 03/20/2016 0404   CHOLHDL 5.8 03/20/2016 0404   VLDL 18 03/20/2016 0404   LDLCALC 134 (H) 03/20/2016 0404    ASSESSMENT:    1. Persistent atrial fibrillation (HCC)   2. Essential hypertension   3. CKD (chronic kidney disease) stage 3, GFR 30-59 ml/min   4. Dyslipidemia (high LDL; low HDL)      PLAN:  In order of problems listed above:  1. AFib: Maintaining sinus rhythm and improved symptoms after cardioversion two weeks ago. Continue flecainide and carvedilol. Embolic risk is low (CHADSVasc 1). Plan to DC Xarelto If no further procedures are  necessary, but we'll keep him on it until he sees Dr. Elberta Fortis in December, in case they decide to go ahead with ablation.  2. HTN: Well controlled. Ignored, untreated hypertension could explain both his cardiac abnormalities and kidney abnormalities, although he states that his blood pressure was normal when checked in the past. 3. CKD: GFR around 50. May have transient ATN (due to hypovolemia) on background of chronic abnormality, presumably secondary to hypertensive nephrosclerosis. Urinalysis is now quiet. Refer to Nephrology for at least one assessment to make sure we are not missing primary kidney abnormality with secondary HTN. 4. HLP: weight loss and exercise are recommended. Recheck in several months. Borderline indication for pharmacological therapy.   Medication Adjustments/Labs and Tests Ordered: Current medicines are reviewed at length with the patient today.  Concerns regarding medicines are outlined above.  Medication changes, Labs and Tests ordered today are listed in the Patient Instructions below. Patient Instructions  Dr Royann Shivers recommends that you schedule  a follow-up appointment in 6 months. You will receive a reminder letter in the mail two months in advance. If you don't receive a letter, please call our office to schedule the follow-up appointment.  If you need a refill on your cardiac medications before your next appointment, please call your pharmacy.    Signed, Thurmon FairMihai Jehiel Koepp, MD  06/18/2016 5:53 PM    [facility] [address], [place], [place]  [address] Phone: [phone](336) [phone]; Fax: [phone](336) [phone]

## 2016-06-18 NOTE — Patient Instructions (Signed)
Dr Croitoru recommends that you schedule a follow-up appointment in 6 months. You will receive a reminder letter in the mail two months in advance. If you don't receive a letter, please call our office to schedule the follow-up appointment.  If you need a refill on your cardiac medications before your next appointment, please call your pharmacy. 

## 2016-06-20 ENCOUNTER — Other Ambulatory Visit: Payer: Self-pay | Admitting: Cardiovascular Disease

## 2016-06-24 ENCOUNTER — Other Ambulatory Visit: Payer: Self-pay

## 2016-06-24 MED ORDER — DILTIAZEM HCL ER COATED BEADS 240 MG PO CP24: 240.0000 mg | ORAL_CAPSULE | Freq: Every day | ORAL | 3 refills | Status: DC

## 2016-07-08 ENCOUNTER — Other Ambulatory Visit: Payer: Self-pay

## 2016-07-08 MED ORDER — CARVEDILOL 6.25 MG PO TABS: 6.2500 mg | ORAL_TABLET | Freq: Two times a day (BID) | ORAL | 2 refills | Status: DC

## 2016-07-08 MED ORDER — FLECAINIDE ACETATE 50 MG PO TABS: 50.0000 mg | ORAL_TABLET | Freq: Two times a day (BID) | ORAL | 2 refills | Status: DC

## 2016-07-08 MED ORDER — XARELTO 20 MG PO TABS: 20.0000 mg | ORAL_TABLET | Freq: Every day | ORAL | 2 refills | Status: DC

## 2016-07-30 ENCOUNTER — Encounter: Payer: Self-pay | Admitting: Cardiology

## 2016-07-30 ENCOUNTER — Ambulatory Visit (INDEPENDENT_AMBULATORY_CARE_PROVIDER_SITE_OTHER): Payer: BLUE CROSS/BLUE SHIELD | Admitting: Cardiology

## 2016-07-30 VITALS — BP 120/82 | HR 52 | Ht 71.0 in | Wt 204.0 lb

## 2016-07-30 DIAGNOSIS — I481 Persistent atrial fibrillation: Secondary | ICD-10-CM | POA: Diagnosis not present

## 2016-07-30 DIAGNOSIS — I4819 Other persistent atrial fibrillation: Secondary | ICD-10-CM

## 2016-07-30 DIAGNOSIS — I480 Paroxysmal atrial fibrillation: Secondary | ICD-10-CM | POA: Diagnosis not present

## 2016-07-30 MED ORDER — CARVEDILOL 12.5 MG PO TABS: 12.5000 mg | ORAL_TABLET | Freq: Two times a day (BID) | ORAL | 3 refills | Status: DC

## 2016-07-30 NOTE — Progress Notes (Signed)
Great. Thanks. Yashas Camilli 

## 2016-07-30 NOTE — Progress Notes (Signed)
Electrophysiology Office Note   Date:  07/30/2016   ID:  Mark Ponce, DOB 02/19/1971, MRN [id number]  PCP:  Neldon LabellaMILLER,LISA LYNN, MD  Cardiologist:  Croitoru Primary Electrophysiologist:  Will Jorja LoaMartin Camnitz, MD    Chief Complaint  Patient presents with  . Follow-up    Persistent AFib     History of Present Illness: Mark Ponce is a 45 y.o. male who presents today for electrophysiology evaluation.   Diagnosed persistent atrial fibrillation And early recurrence after initial attempt at elective cardioversion. He was subsequently placed on 50 mg of flecainide with repeat cardioversion. He has maintained normal rhythm. He says that he could tell after his first cardioversion when he went back out of rhythm. His symptoms were fatigue and weakness, as well as occasional palpitations. After a second cardioversion, he returned to sinus rhythm and has not had any symptoms consistent with his atrial fibrillation.   Today, he denies symptoms of palpitations, chest pain, shortness of breath, orthopnea, PND, lower extremity edema, claudication, dizziness, presyncope, syncope, bleeding, or neurologic sequela. The patient is tolerating medications without difficulties and is otherwise without complaint today. He says that he has had no further palpitations since starting flecainide.   Past Medical History:  Diagnosis Date  . AKI (acute kidney injury) (HCC) 02/2016  . Hypokalemia 02/2016  . New onset atrial fibrillation (HCC) 02/2016   Past Surgical History:  Procedure Laterality Date  . CARDIOVERSION N/A 03/24/2016   Procedure: CARDIOVERSION;  Surgeon: Thurmon FairMihai Croitoru, MD;  [facility]: [facility];  Service: Cardiovascular;  Laterality: N/A;  . CARDIOVERSION N/A 04/23/2016   Procedure: CARDIOVERSION;  Surgeon: Chilton Siiffany [place], MD;  [facility];  Service: Cardiovascular;  Laterality: N/A;  . NO PAST SURGERIES    . TEE WITHOUT CARDIOVERSION N/A 03/24/2016   Procedure:  TRANSESOPHAGEAL ECHOCARDIOGRAM (TEE);  Surgeon: Thurmon FairMihai Croitoru, MD;  [facility];  Service: Cardiovascular;  Laterality: N/A;     Current Outpatient Prescriptions  Medication Sig Dispense Refill  . carvedilol (COREG) 6.25 MG tablet Take 1 tablet (6.25 mg total) by mouth 2 (two) times daily. 180 tablet 2  . diltiazem (CARDIZEM CD) 240 MG 24 hr capsule Take 1 capsule (240 mg total) by mouth daily. 90 capsule 3  . flecainide (TAMBOCOR) 50 MG tablet Take 1 tablet (50 mg total) by mouth 2 (two) times daily. 180 tablet 2  . XARELTO 20 MG TABS tablet Take 1 tablet (20 mg total) by mouth daily with supper. 90 tablet 2   No current facility-administered medications for this visit.     Allergies:   Patient has no known allergies.   Social History:  The patient  reports that he has never smoked. He has never used smokeless tobacco. He reports that he does not drink alcohol or use drugs.   Family History:  The patient's parents and siblings are healthy without issue.  ROS:  Please see the history of present illness.   Otherwise, review of systems is positive for none.   All other systems are reviewed and negative.    PHYSICAL EXAM: VS:  BP 120/82   Pulse (!) 52   Ht 5\' 11"  (1.803 m)   Wt 204 lb (92.5 kg)   BMI 28.45 kg/m  , BMI Body mass index is 28.45 kg/m. GEN: Well nourished, well developed, in no acute distress  HEENT: normal  Neck: no JVD, carotid bruits, or masses Cardiac: RRR; no murmurs, rubs, or gallops,no edema  Respiratory:  clear to auscultation bilaterally, normal  work of breathing GI: soft, nontender, nondistended, + BS MS: no deformity or atrophy  Skin: warm and dry Neuro:  Strength and sensation are intact Psych: euthymic mood, full affect  EKG:  EKG is ordered today. Personal review of the ekg ordered shows sinus rhythm, rate 52, lateral T wave inversion  Recent Labs: 03/19/2016: ALT 60 03/20/2016: B Natriuretic Peptide 657.5; Magnesium 2.4 04/22/2016: BUN  25; Creat 1.78; Hemoglobin 14.0; Platelets 205; Potassium 4.9; Sodium 139; TSH 1.42    Lipid Panel     Component Value Date/Time   CHOL 184 03/20/2016 0404   TRIG 88 03/20/2016 0404   HDL 32 (L) 03/20/2016 0404   CHOLHDL 5.8 03/20/2016 0404   VLDL 18 03/20/2016 0404   LDLCALC 134 (H) 03/20/2016 0404     Wt Readings from Last 3 Encounters:  07/30/16 204 lb (92.5 kg)  06/18/16 211 lb 3.2 oz (95.8 kg)  05/05/16 211 lb 6.4 oz (95.9 kg)      Other studies Reviewed: Additional studies/ records that were reviewed today include: TTE 03/20/16, SPECT 03/31/16  Review of the above records today demonstrates:  - Left ventricle: The cavity size was normal. Wall thickness was   increased in a pattern of mild LVH. Systolic function was normal.   The estimated ejection fraction was in the range of 55% to 60%.   Wall motion was normal; there were no regional wall motion   abnormalities. The study is not technically sufficient to allow   evaluation of LV diastolic function. - Aorta: Aortic root dimension: 39 mm (ED). - Ascending aorta: The ascending aorta was mildly dilated. - Mitral valve: There was mild regurgitation. - Left atrium: The atrium was mildly dilated. Volume/bsa, ES,   (1-plane Simpson&'s, A2C): 36.2 ml/m^2. - Right atrium: The atrium was mildly dilated.   There was no ST segment deviation noted during stress.  This is a low risk study. No perfusion defects, no ischemia. Non-gated images secondary to atrial fibrillation. No ejection fraction calculated.   ASSESSMENT AND PLAN:  1.  Persistent atrial fibrillation: Currently in sinus rhythm on flecainide, Coreg, diltiazem, and Xarelto. He is feeling well and feels that he is remained in sinus rhythm. His stroke risk is currently low, as he questionably has hypertension. We'll therefore stop his Xarelto. He is on diltiazem and carvedilol, and it is unlikely that he needs such strict heart rate control, as he does plan to become  much more active in the next few weeks with more running. We'll therefore stop his diltiazem and increase his carvedilol to 12.5 mg. We will continue him on his flecainide.  This patients CHA2DS2-VASc Score and unadjusted Ischemic Stroke Rate (% per year) is equal to 0.6 % stroke rate/year from a score of 1  Above score calculated as 1 point each if present [CHF, HTN, DM, Vascular=MI/PAD/Aortic Plaque, Age if 65-74, or Male] Above score calculated as 2 points each if present [Age > 75, or Stroke/TIA/TE]   2. Hypertension: Currently well controlled, questionable diagnosis.    Current medicines are reviewed at length with the patient today.   The patient does not have concerns regarding his medicines.  The following changes were made today:  Stop Xarelto and diltiazem, increase carvedilol to 12.5 mg twice daily  Labs/ tests ordered today include:  No orders of the defined types were placed in this encounter.    Disposition:   FU with Will Camnitz 6 months  Signed, Will Jorja LoaMartin Camnitz, MD  07/30/2016 8:31 AM  [place] [address] [place] [address] [phone] (office) [phone] (fax)

## 2016-07-30 NOTE — Patient Instructions (Addendum)
Medication Instructions:    Your physician has recommended you make the following change in your medication:  1) STOP Diltiazem 2) STOP Xarelto 3) INCREASE Carvedilol to 12.5 mg twice a day  (a new prescription for 12.5 mg tablets sent to pharmacy)  --- If you need a refill on your cardiac medications before your next appointment, please call your pharmacy. ---  Labwork:  None ordered  Testing/Procedures:  None ordered  Follow-Up:  Your physician wants you to follow-up in: 6 months with Dr. Elberta Fortisamnitz.  You will receive a reminder letter in the mail two months in advance. If you don't receive a letter, please call our office to schedule the follow-up appointment.  Thank you for choosing [facility]!!   Dory HornSherri Odie Rauen, RN [phone]

## 2017-04-28 ENCOUNTER — Other Ambulatory Visit: Payer: Self-pay | Admitting: Cardiovascular Disease

## 2017-04-28 NOTE — Telephone Encounter (Signed)
REFILL 

## 2017-05-04 ENCOUNTER — Telehealth: Payer: Self-pay | Admitting: Cardiovascular Disease

## 2017-05-04 NOTE — Telephone Encounter (Signed)
New message    Pt is calling stating his heart is out of rhythm. He scheduled an appt with Theodore Demarkhonda Barrett on 9/20 at 3.  Patient c/o Palpitations:  High priority if patient c/o lightheadedness and shortness of breath.  1. How long have you been having palpitations? A few days  2. Are you currently experiencing lightheadedness and shortness of breath? No, pt states he can't sleep.  3. Have you checked your BP and heart rate? (document readings) 123/75 57-yesterday  4. Are you experiencing any other symptoms? No.

## 2017-05-04 NOTE — Telephone Encounter (Signed)
Returned cal to patient.He stated he felt like all of last night his heart was out of rhythm.Stated he felt the same way last year when heart out of rhythm.Stated he feels ok at present he is at work.Stated he would like appointment with Dr.Camnitz.[facility] 8:45 am.Advised if he feels like heart goes out of rhythm again [facility].

## 2017-05-05 NOTE — Progress Notes (Signed)
Electrophysiology Office Note   Date:  05/06/2017   ID:  Mark Ponce, DOB 1970/11/03, MRN [id number]  PCP:  Sigmund Hazel, MD  Cardiologist:  Croitoru Primary Electrophysiologist:  Alexandre Faries [name], MD    Chief Complaint  Patient presents with  . Follow-up    Persistent Afib/SOB     History of Present Illness: Mark Ponce is a 46 y.o. male who was diagnosed persistent atrial fibrillation with early recurrence after initial attempt at elective cardioversion. He was subsequently placed on 50 mg of flecainide with repeat cardioversion. He has maintained normal rhythm. He had return to AF and had a second cardioversion and has maintained SR.   Today, denies symptoms of palpitations, chest pain, shortness of breath, orthopnea, PND, lower extremity edema, claudication, dizziness, presyncope, syncope, bleeding, or neurologic sequela. The patient is tolerating medications without difficulties. He did have an episode of atrial fibrillation on Sunday. He felt palpitations and fatigue. It lasted a few hours and he converted on his own. He was having trouble sleeping in atrial fibrillation and has had somewhat distorted sleep since that time. He does note that since converting to sinus rhythm that he is feeling much improved.    Past Medical History:  Diagnosis Date  . AKI (acute kidney injury) (HCC) 02/2016  . Hypokalemia 02/2016  . New onset atrial fibrillation (HCC) 02/2016   Past Surgical History:  Procedure Laterality Date  . CARDIOVERSION N/A 03/24/2016   Procedure: CARDIOVERSION;  Surgeon: Thurmon Fair, MD;  Location: [facility] ENDOSCOPY;  Service: Cardiovascular;  Laterality: N/A;  . CARDIOVERSION N/A 04/23/2016   Procedure: CARDIOVERSION;  Surgeon: Chilton Si, MD;  Location: [facility] ENDOSCOPY;  Service: Cardiovascular;  Laterality: N/A;  . NO PAST SURGERIES    . TEE WITHOUT CARDIOVERSION N/A 03/24/2016   Procedure: TRANSESOPHAGEAL ECHOCARDIOGRAM (TEE);  Surgeon: Thurmon Fair, MD;  Location: [facility] ENDOSCOPY;  Service: Cardiovascular;  Laterality: N/A;     Current Outpatient Prescriptions  Medication Sig Dispense Refill  . carvedilol (COREG) 6.25 MG tablet Take 1 tablet (6.25 mg total) by mouth 2 (two) times daily. NEED OV. 60 tablet 0  . flecainide (TAMBOCOR) 50 MG tablet Take 1 tablet (50 mg total) by mouth 2 (two) times daily. 180 tablet 2   No current facility-administered medications for this visit.     Allergies:   Patient has no known allergies.   Social History:  The patient  reports that he has never smoked. He has never used smokeless tobacco. He reports that he does not drink alcohol or use drugs.   Family History:  The patient's parents and siblings are healthy without issue.  ROS:  Please see the history of present illness.   Otherwise, review of systems is positive for Palpitations.   All other systems are reviewed and negative.   PHYSICAL EXAM: VS:  BP 124/86   Pulse (!) 56   Ht  (1.803 m)   Wt 207 lb 3.2 oz (94 kg)   BMI 28.90 kg/m  , BMI Body mass index is 28.9 kg/m. GEN: Well nourished, well developed, in no acute distress  HEENT: normal  Neck: no JVD, carotid bruits, or masses Cardiac: RRR; no murmurs, rubs, or gallops,no edema  Respiratory:  clear to auscultation bilaterally, normal work of breathing GI: soft, nontender, nondistended, + BS MS: no deformity or atrophy  Skin: warm and dry Neuro:  Strength and sensation are intact Psych: euthymic mood, full affect  EKG:  EKG is ordered today. Personal review  of the ekg ordered shows sinus rhythm, inferolateral T wave changes (old), rate 56  Recent Labs: No results found for requested labs within last 8760 hours.    Lipid Panel     Component Value Date/Time   CHOL 184 03/20/2016 0404   TRIG 88 03/20/2016 0404   HDL 32 (L) 03/20/2016 0404   CHOLHDL 5.8 03/20/2016 0404   VLDL 18 03/20/2016 0404   LDLCALC 134 (H) 03/20/2016 0404     Wt Readings from Last 3  Encounters:  05/06/17 207 lb 3.2 oz (94 kg)  07/30/16 204 lb (92.5 kg)  06/18/16 211 lb 3.2 oz (95.8 kg)      Other studies Reviewed: Additional studies/ records that were reviewed today include: TTE 03/20/16, SPECT 03/31/16  Review of the above records today demonstrates:  - Left ventricle: The cavity size was normal. Wall thickness was   increased in a pattern of mild LVH. Systolic function was normal.   The estimated ejection fraction was in the range of 55% to 60%.   Wall motion was normal; there were no regional wall motion   abnormalities. The study is not technically sufficient to allow   evaluation of LV diastolic function. - Aorta: Aortic root dimension: 39 mm (ED). - Ascending aorta: The ascending aorta was mildly dilated. - Mitral valve: There was mild regurgitation. - Left atrium: The atrium was mildly dilated. Volume/bsa, ES,   (1-plane Simpson&'s, A2C): 36.2 ml/m^2. - Right atrium: The atrium was mildly dilated.   There was no ST segment deviation noted during stress.  This is a low risk study. No perfusion defects, no ischemia. Non-gated images secondary to atrial fibrillation. No ejection fraction calculated.   ASSESSMENT AND PLAN:  1.  Persistent atrial fibrillation: On carvedilol and flecainide. Did have an episode of atrial fibrillation earlier this week. I discussed the possibility of increasing flecainide, which he is amenable to. We'll increase to 100 mg twice a day and continue his current dose of carvedilol. No need for anticoagulation. We'll plan for an EKG in 1 week.  This patients CHA2DS2-VASc Score and unadjusted Ischemic Stroke Rate (% per year) is equal to 0.6 % stroke rate/year from a score of 1  Above score calculated as 1 point each if present [CHF, HTN, DM, Vascular=MI/PAD/Aortic Plaque, Age if 65-74, or Male] Above score calculated as 2 points each if present [Age > 75, or Stroke/TIA/TE]     2. Hypertension: well controlled. Questionable  diagnosis.    Current medicines are reviewed at length with the patient today.   The patient does not have concerns regarding his medicines.  The following changes were made today:  Increase flecainide  Labs/ tests ordered today include:  No orders of the defined types were placed in this encounter.    Disposition:   FU with Geralynn Capri 6 months  Signed, Mikale Silversmith Jorja LoaMartin Verena Shawgo, MD  05/06/2017 8:55 AM     [facility] [address] [place] [address] [phone] (office) [phone] (fax)

## 2017-05-06 ENCOUNTER — Ambulatory Visit (INDEPENDENT_AMBULATORY_CARE_PROVIDER_SITE_OTHER): Payer: BLUE CROSS/BLUE SHIELD | Admitting: Cardiology

## 2017-05-06 ENCOUNTER — Encounter: Payer: Self-pay | Admitting: Cardiology

## 2017-05-06 VITALS — BP 124/86 | HR 56 | Ht 71.0 in | Wt 207.2 lb

## 2017-05-06 DIAGNOSIS — I481 Persistent atrial fibrillation: Secondary | ICD-10-CM | POA: Diagnosis not present

## 2017-05-06 DIAGNOSIS — I4819 Other persistent atrial fibrillation: Secondary | ICD-10-CM

## 2017-05-06 MED ORDER — FLECAINIDE ACETATE 100 MG PO TABS: 100.0000 mg | ORAL_TABLET | Freq: Two times a day (BID) | ORAL | 6 refills | Status: DC

## 2017-05-06 NOTE — Patient Instructions (Addendum)
Medication Instructions:  Your physician has recommended you make the following change in your medication: 1. INCREASE Flecainide to 100 mg twice daily  If you need a refill on your cardiac medications before your next appointment, please call your pharmacy.   Labwork: None ordered  Testing/Procedures: None ordered  Follow-Up: Your physician recommends that you schedule a follow-up appointment in: 1 week for a nurse visit EKG.  Your physician wants you to follow-up in 6 months with Dr. Elberta Fortisamnitz.  You will receive a reminder letter in the mail two months in advance. If you don't receive a letter, please call our office to schedule the follow-up appointment.  Thank you for choosing [facility]!!   Dory HornSherri Chasta Deshpande, RN [phone]  Any Other Special Instructions Will Be Listed Below (If Applicable).

## 2017-05-13 ENCOUNTER — Ambulatory Visit: Payer: BLUE CROSS/BLUE SHIELD

## 2017-05-14 ENCOUNTER — Ambulatory Visit: Payer: BLUE CROSS/BLUE SHIELD | Admitting: Physician Assistant

## 2017-05-15 ENCOUNTER — Ambulatory Visit (INDEPENDENT_AMBULATORY_CARE_PROVIDER_SITE_OTHER): Payer: BLUE CROSS/BLUE SHIELD

## 2017-05-15 VITALS — BP 110/70 | HR 57 | Ht 71.0 in | Wt 212.8 lb

## 2017-05-15 DIAGNOSIS — I4891 Unspecified atrial fibrillation: Secondary | ICD-10-CM | POA: Diagnosis not present

## 2017-05-15 NOTE — Patient Instructions (Signed)
1.) Reason for visit: EKG  2.) Name of MD requesting visit: Dr.Camnitz  3.) H&P: Flecainide increased to 100 mg twice a day on 05/06/17  4.) ROS related to problem: no complaints  5.) Assessment and plan per MD: EKG reviewed by Nada Boozer NP. Advised no change continue same medications

## 2017-05-31 ENCOUNTER — Other Ambulatory Visit: Payer: Self-pay | Admitting: Cardiovascular Disease

## 2017-06-13 ENCOUNTER — Other Ambulatory Visit: Payer: Self-pay | Admitting: Cardiovascular Disease

## 2017-06-25 ENCOUNTER — Other Ambulatory Visit: Payer: Self-pay | Admitting: Cardiology

## 2017-06-25 MED ORDER — FLECAINIDE ACETATE 100 MG PO TABS: 100.0000 mg | ORAL_TABLET | Freq: Two times a day (BID) | ORAL | 10 refills | Status: DC

## 2017-06-25 MED ORDER — CARVEDILOL 6.25 MG PO TABS: ORAL_TABLET | ORAL | 10 refills | Status: DC

## 2017-06-25 NOTE — Telephone Encounter (Signed)
Pt's medications was sent to pt's pharmacy as requested. Confirmation received.  

## 2017-06-30 ENCOUNTER — Other Ambulatory Visit: Payer: Self-pay | Admitting: Cardiovascular Disease

## 2017-07-07 ENCOUNTER — Other Ambulatory Visit: Payer: Self-pay | Admitting: *Deleted

## 2017-07-07 MED ORDER — CARVEDILOL 6.25 MG PO TABS: ORAL_TABLET | ORAL | 10 refills | Status: DC

## 2017-10-19 NOTE — Progress Notes (Signed)
Electrophysiology Office Note   Date:  10/20/2017   ID:  Mark Ponce, DOB March 26, 1971, MRN [id number]  PCP:  Sigmund Hazel, MD  Cardiologist:  Croitoru Primary Electrophysiologist:  Alisea Matte [name], MD    Chief Complaint  Patient presents with  . Follow-up    Persistent Afib     History of Present Illness: Mark Ponce is a 47 y.o. male who was diagnosed persistent atrial fibrillation with early recurrence after initial attempt at elective cardioversion. He was subsequently placed on 50 mg of flecainide with repeat cardioversion. He has maintained normal rhythm. He had return to AF and had a second cardioversion and has maintained SR.  Today, denies symptoms of palpitations, chest pain, shortness of breath, orthopnea, PND, lower extremity edema, claudication, dizziness, presyncope, syncope, bleeding, or neurologic sequela. The patient is tolerating medications without difficulties.  He is currently feeling well without major complaints.  He is noted no further episodes of atrial fibrillation.  He has had 1-2 beats that he feels at times.  He does have APCs on his EKG today.  Past Medical History:  Diagnosis Date  . AKI (acute kidney injury) (HCC) 02/2016  . Hypokalemia 02/2016  . New onset atrial fibrillation (HCC) 02/2016   Past Surgical History:  Procedure Laterality Date  . CARDIOVERSION N/A 03/24/2016   Procedure: CARDIOVERSION;  Surgeon: Thurmon Fair, MD;  Location: [facility] ENDOSCOPY;  Service: Cardiovascular;  Laterality: N/A;  . CARDIOVERSION N/A 04/23/2016   Procedure: CARDIOVERSION;  Surgeon: Chilton Si, MD;  Location: [facility] ENDOSCOPY;  Service: Cardiovascular;  Laterality: N/A;  . NO PAST SURGERIES    . TEE WITHOUT CARDIOVERSION N/A 03/24/2016   Procedure: TRANSESOPHAGEAL ECHOCARDIOGRAM (TEE);  Surgeon: Thurmon Fair, MD;  Location: [facility] ENDOSCOPY;  Service: Cardiovascular;  Laterality: N/A;     Current Outpatient Medications  Medication Sig Dispense Refill    . carvedilol (COREG) 6.25 MG tablet TAKE 1 TABLET (6.25 MG TOTAL) BY MOUTH 2 (TWO) TIMES DAILY. 60 tablet 10  . flecainide (TAMBOCOR) 100 MG tablet Take 1 tablet (100 mg total) by mouth 2 (two) times daily. 60 tablet 10   No current facility-administered medications for this visit.     Allergies:   Patient has no known allergies.   Social History:  The patient  reports that  has never smoked. he has never used smokeless tobacco. He reports that he does not drink alcohol or use drugs.   Family History:  The patient's parents and siblings are healthy without issue.  ROS:  Please see the history of present illness.   Otherwise, review of systems is positive for none.   All other systems are reviewed and negative.   PHYSICAL EXAM: VS:  BP 130/90   Pulse (!) 50   Ht 6' (1.829 m)   Wt 211 lb (95.7 kg)   BMI 28.62 kg/m  , BMI Body mass index is 28.62 kg/m. GEN: Well nourished, well developed, in no acute distress  HEENT: normal  Neck: no JVD, carotid bruits, or masses Cardiac: RRR; no murmurs, rubs, or gallops,no edema  Respiratory:  clear to auscultation bilaterally, normal work of breathing GI: soft, nontender, nondistended, + BS MS: no deformity or atrophy  Skin: warm and dry Neuro:  Strength and sensation are intact Psych: euthymic mood, full affect  EKG:  EKG is ordered today. Personal review of the ekg ordered shows sinus rhythm, APCs, rate 50, lateral TWI   Recent Labs: No results found for requested labs within last 8760 hours.  Lipid Panel     Component Value Date/Time   CHOL 184 03/20/2016 0404   TRIG 88 03/20/2016 0404   HDL 32 (L) 03/20/2016 0404   CHOLHDL 5.8 03/20/2016 0404   VLDL 18 03/20/2016 0404   LDLCALC 134 (H) 03/20/2016 0404     Wt Readings from Last 3 Encounters:  10/20/17 211 lb (95.7 kg)  05/15/17 212 lb 12 oz (96.5 kg)  05/06/17 207 lb 3.2 oz (94 kg)      Other studies Reviewed: Additional studies/ records that were reviewed today  include: TTE 03/20/16, SPECT 03/31/16  Review of the above records today demonstrates:  - Left ventricle: The cavity size was normal. Wall thickness was   increased in a pattern of mild LVH. Systolic function was normal.   The estimated ejection fraction was in the range of 55% to 60%.   Wall motion was normal; there were no regional wall motion   abnormalities. The study is not technically sufficient to allow   evaluation of LV diastolic function. - Aorta: Aortic root dimension: 39 mm (ED). - Ascending aorta: The ascending aorta was mildly dilated. - Mitral valve: There was mild regurgitation. - Left atrium: The atrium was mildly dilated. Volume/bsa, ES,   (1-plane Simpson&'s, A2C): 36.2 ml/m^2. - Right atrium: The atrium was mildly dilated.   There was no ST segment deviation noted during stress.  This is a low risk study. No perfusion defects, no ischemia. Non-gated images secondary to atrial fibrillation. No ejection fraction calculated.   ASSESSMENT AND PLAN:  1.  Persistent atrial fibrillation: He on carvedilol and flecainide.  He is not on anticoagulation due to a low stroke risk.  He has not had further episodes of atrial fibrillation.  We Frank Novelo continue with current management.  I did discuss with him that he can have a glass of wine occasionally.  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  This patients CHA2DS2-VASc Score and unadjusted Ischemic Stroke Rate (% per year) is equal to 0.6 % stroke rate/year from a score of 1  Above score calculated as 1 point each if present [CHF, HTN, DM, Vascular=MI/PAD/Aortic Plaque, Age if 65-74, or Male] Above score calculated as 2 points each if present [Age > 75, or Stroke/TIA/TE]  2. Hypertension: Well-controlled.  This is a questionable diagnosis.  He is on carvedilol for atrial fibrillation and not necessarily hypertension.    Current medicines are reviewed at length with the patient today.   The patient does not have concerns regarding  his medicines.  The following changes were made today: None  Labs/ tests ordered today include:  Orders Placed This Encounter  Procedures  . EKG 12-Lead     Disposition:   FU with Amman Bartel 12 months  Signed, Ahja Martello Jorja LoaMartin Reily Treloar, MD  10/20/2017 8:53 AM     [facility] [address] [place] [address] [phone] (office) [phone] (fax)

## 2017-10-20 ENCOUNTER — Encounter: Payer: Self-pay | Admitting: Cardiology

## 2017-10-20 ENCOUNTER — Ambulatory Visit (INDEPENDENT_AMBULATORY_CARE_PROVIDER_SITE_OTHER): Payer: BLUE CROSS/BLUE SHIELD | Admitting: Cardiology

## 2017-10-20 VITALS — BP 130/90 | HR 50 | Ht 72.0 in | Wt 211.0 lb

## 2017-10-20 DIAGNOSIS — I481 Persistent atrial fibrillation: Secondary | ICD-10-CM

## 2017-10-20 DIAGNOSIS — I4819 Other persistent atrial fibrillation: Secondary | ICD-10-CM

## 2017-10-20 NOTE — Patient Instructions (Signed)
Medication Instructions:  Your physician recommends that you continue on your current medications as directed. Please refer to the Current Medication list given to you today.  * If you need a refill on your cardiac medications before your next appointment, please call your pharmacy.   Labwork: None ordered  Testing/Procedures: None ordered  Follow-Up: Your physician wants you to follow-up in: 1 year with Dr. Camnitz.  You will receive a reminder letter in the mail two months in advance. If you don't receive a letter, please call our office to schedule the follow-up appointment.  Thank you for choosing [facility]!!   Clifford Coudriet, RN [phone]        

## 2017-10-26 ENCOUNTER — Telehealth: Payer: Self-pay | Admitting: Cardiology

## 2017-10-26 NOTE — Telephone Encounter (Signed)
New Message    Patient states that he was told that he needed to call and find out whether or not he needs to keep his appointment with Dr. Jomarie Longsroituru on 10/27/2017. He advised that Dr. Elberta Fortisamnitz advised him to call and see. Please call to discuss.

## 2017-10-26 NOTE — Telephone Encounter (Signed)
Received call from patient, recommended keeping appointment with Dr. Royann Shiversroitoru.  Patient request to reschedule.  appt rescheduled to 3/21 at 8:40 AM.    Patient verbalized understanding.

## 2017-10-26 NOTE — Telephone Encounter (Signed)
Left message to call back  

## 2017-10-27 ENCOUNTER — Ambulatory Visit: Payer: BLUE CROSS/BLUE SHIELD | Admitting: Cardiovascular Disease

## 2017-11-12 ENCOUNTER — Ambulatory Visit: Payer: BLUE CROSS/BLUE SHIELD | Admitting: Cardiovascular Disease

## 2017-12-01 ENCOUNTER — Ambulatory Visit (INDEPENDENT_AMBULATORY_CARE_PROVIDER_SITE_OTHER): Payer: BLUE CROSS/BLUE SHIELD | Admitting: Cardiology

## 2017-12-01 ENCOUNTER — Encounter: Payer: Self-pay | Admitting: Cardiology

## 2017-12-01 DIAGNOSIS — I10 Essential (primary) hypertension: Secondary | ICD-10-CM | POA: Diagnosis not present

## 2017-12-01 DIAGNOSIS — R9431 Abnormal electrocardiogram [ECG] [EKG]: Secondary | ICD-10-CM | POA: Diagnosis not present

## 2017-12-01 DIAGNOSIS — N183 Chronic kidney disease, stage 3 unspecified: Secondary | ICD-10-CM

## 2017-12-01 DIAGNOSIS — I481 Persistent atrial fibrillation: Secondary | ICD-10-CM

## 2017-12-01 DIAGNOSIS — I4819 Other persistent atrial fibrillation: Secondary | ICD-10-CM

## 2017-12-01 NOTE — Assessment & Plan Note (Signed)
Last SCr was 1.7 in 2017- as far as he knows this has never been re checked.

## 2017-12-01 NOTE — Progress Notes (Signed)
Thank you MCr 

## 2017-12-01 NOTE — Progress Notes (Signed)
12/01/2017 LUZ BURCHER   Mar 24, 1971  [id number]  Primary Physician Sigmund Hazel, MD Primary Cardiologist: Dr Royann Shivers / Dr Elberta Fortis  HPI:  47 y/o male with a history of PAF going back to 2017. He is a CHADS VASC=1 and he is not anticoagulated. Prior echocardiogram demonstrated mild left ventricular hypertrophy and mild left atrial dilatation. There was normal left ventricular systolic function and there were no regional wall motion abnormalities. His nuclear stress test is normal. Dr Elberta Fortis saw him Feb 2019. He is in the office today for routine general cardiology follow up. He has been doing well, no palpitations or tachycardia. I did notice his SCr was 1.7 in 2017. He was apparently referred to nephrology but he doesn't remember ever having this appointment and as far as he knows this has never been followed up.     Current Outpatient Medications  Medication Sig Dispense Refill  . carvedilol (COREG) 6.25 MG tablet TAKE 1 TABLET (6.25 MG TOTAL) BY MOUTH 2 (TWO) TIMES DAILY. 60 tablet 10  . flecainide (TAMBOCOR) 100 MG tablet Take 1 tablet (100 mg total) by mouth 2 (two) times daily. 60 tablet 10   No current facility-administered medications for this visit.     No Known Allergies  Past Medical History:  Diagnosis Date  . AKI (acute kidney injury) (HCC) 02/2016  . Hypokalemia 02/2016  . New onset atrial fibrillation (HCC) 02/2016    Social History   Socioeconomic History  . Marital status: Single    Spouse name: Not on file  . Number of children: Not on file  . Years of education: Not on file  . Highest education level: Not on file  Occupational History  . Occupation: Marketing executive  Social Needs  . Financial resource strain: Not on file  . Food insecurity:    Worry: Not on file    Inability: Not on file  . Transportation needs:    Medical: Not on file    Non-medical: Not on file  Tobacco Use  . Smoking status: Never Smoker  . Smokeless tobacco:  Never Used  Substance and Sexual Activity  . Alcohol use: No  . Drug use: No  . Sexual activity: Not on file  Lifestyle  . Physical activity:    Days per week: Not on file    Minutes per session: Not on file  . Stress: Not on file  Relationships  . Social connections:    Talks on phone: Not on file    Gets together: Not on file    Attends religious service: Not on file    Active member of club or organization: Not on file    Attends meetings of clubs or organizations: Not on file    Relationship status: Not on file  . Intimate partner violence:    Fear of current or ex partner: Not on file    Emotionally abused: Not on file    Physically abused: Not on file    Forced sexual activity: Not on file  Other Topics Concern  . Not on file  Social History Narrative   Pt lives alone in [place]. His stepfather died of an MI, but his biological father and his mother have no known CAD.     No family history on file.   Review of Systems: General: negative for chills, fever, night sweats or weight changes.  Cardiovascular: negative for chest pain, dyspnea on exertion, edema, orthopnea, palpitations, paroxysmal nocturnal dyspnea or shortness of breath  Dermatological: negative for rash Respiratory: negative for cough or wheezing Urologic: negative for hematuria Abdominal: negative for nausea, vomiting, diarrhea, bright red blood per rectum, melena, or hematemesis Neurologic: negative for visual changes, syncope, or dizziness All other systems reviewed and are otherwise negative except as noted above.    Blood pressure 118/82, pulse (!) 53, height 5\' 11"  (1.803 m), weight 216 lb 6.4 oz (98.2 kg).  General appearance: alert, cooperative and no distress Neck: no carotid bruit and no JVD Lungs: clear to auscultation bilaterally Heart: regular rate and rhythm Extremities: extremities normal, atraumatic, no cyanosis or edema Skin: Skin color, texture, turgor normal. No rashes or  lesions Neurologic: Grossly normal  EKG NSR, SB, anterior lateral TWI- old  ASSESSMENT AND PLAN:   Persistent atrial fibrillation (HCC) Holding NSR on Flecainide  CKD (chronic kidney disease) stage 3, GFR 30-59 ml/min Last SCr was 1.7 in 2017- as far as he knows this has never been re checked.   Essential hypertension This has been a questionable diagnosis- he does have some echo changes suggesting HCVD but his B/P is not elevated on low dose Coreg.  Abnormal EKG He has anterior lateral TWI on his EKG   PLAN  Check BMP. F/U with Dr Mitchell HeirMCr in one year.   Corine ShelterLuke Huntley Knoop PA-C 12/01/2017 9:44 AM

## 2017-12-01 NOTE — Assessment & Plan Note (Signed)
Holding NSR on Flecainide 

## 2017-12-01 NOTE — Assessment & Plan Note (Signed)
This has been a questionable diagnosis- he does have some echo changes suggesting HCVD but his B/P is not elevated on low dose Coreg.

## 2017-12-01 NOTE — Patient Instructions (Signed)
NO CHANGE WITH MEDICATIONS     Your physician wants you to follow-up in 12 MONTHS WITH DR CROITORU. You will receive a reminder letter in the mail two months in advance. If you don't receive a letter, please call our office to schedule the follow-up appointment.   If you need a refill on your cardiac medications before your next appointment, please call your pharmacy.

## 2017-12-01 NOTE — Assessment & Plan Note (Signed)
He has anterior lateral TWI on his EKG

## 2018-04-06 IMAGING — DX DG CHEST 1V PORT
1 series · 1 of 1 positions shown · non-contrast
Comparison: None

CLINICAL DATA: 45-year-old male with atrial fibrillation. No chest
complaints.

EXAM:
PORTABLE CHEST 1 VIEW

[chest ap]
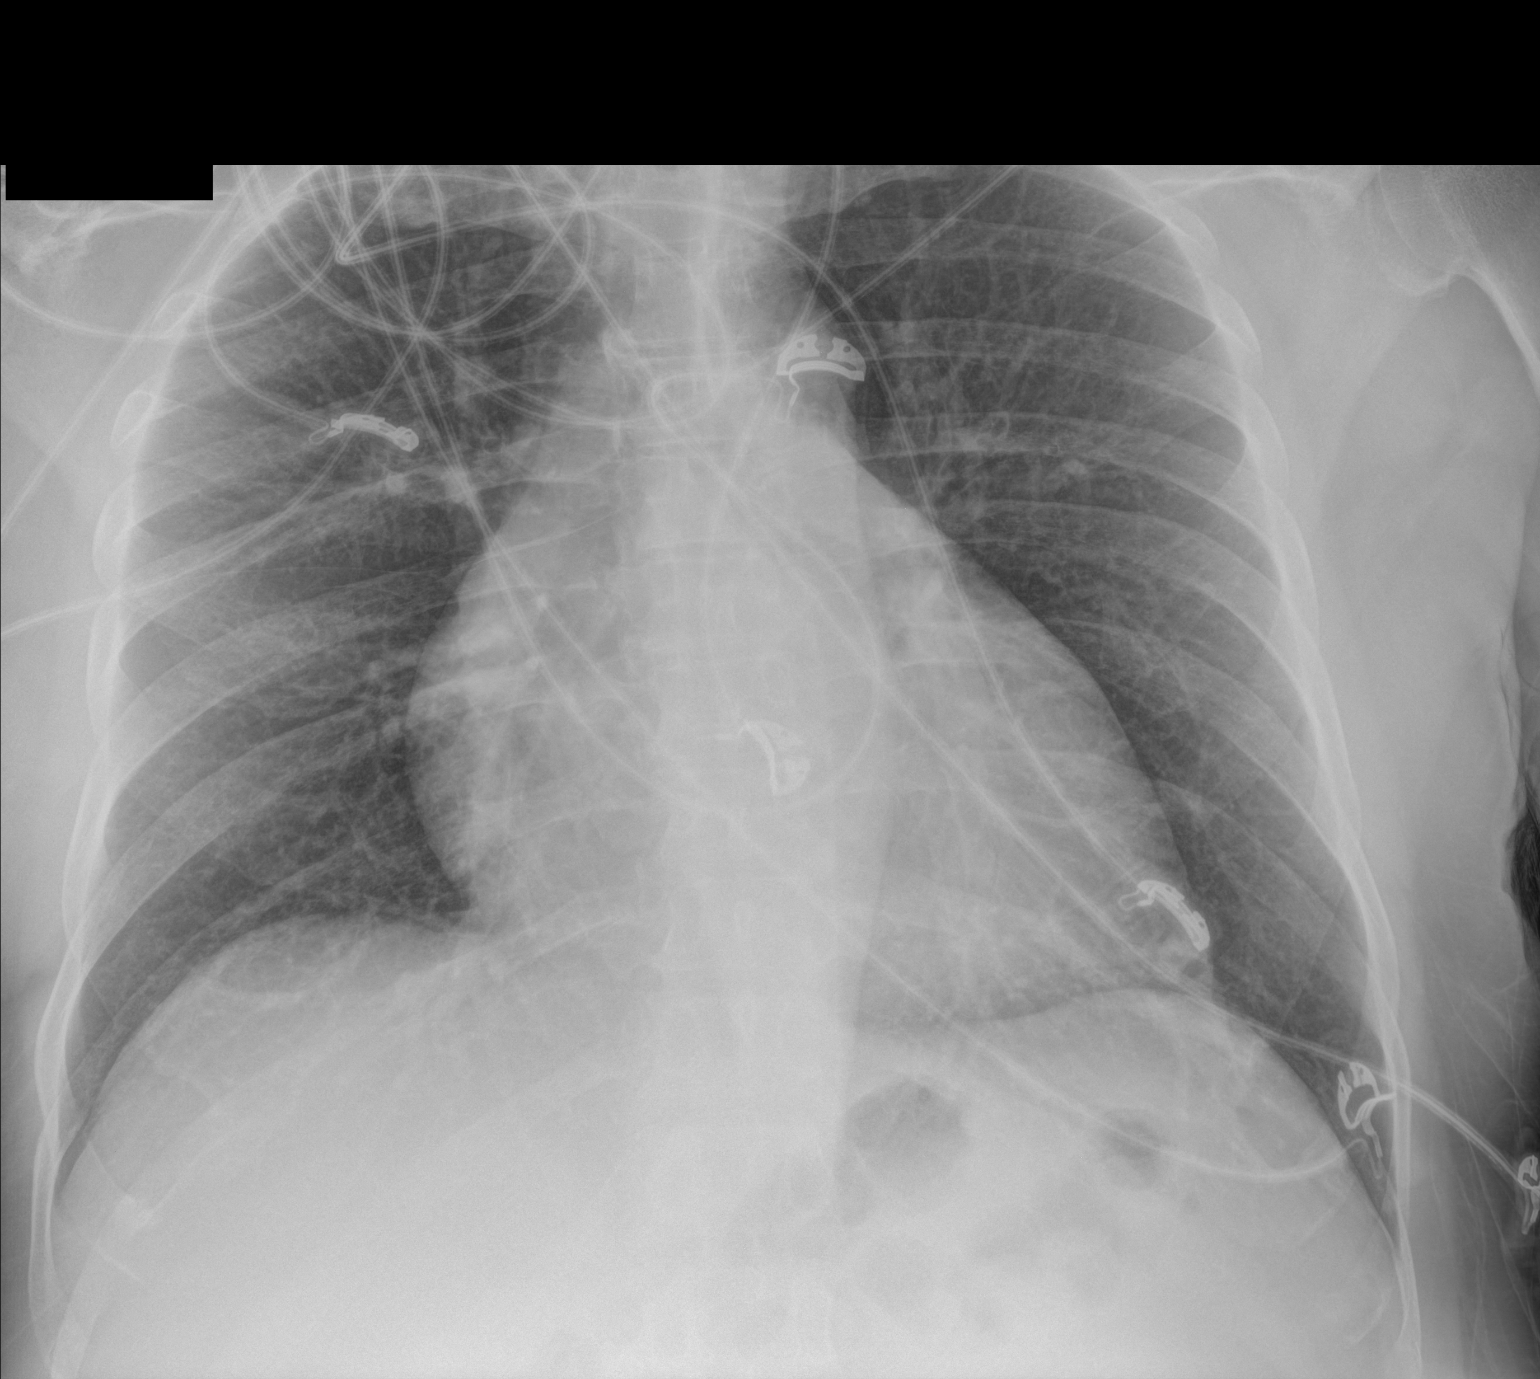

[1 of 1 positions shown; findings below may reference images not displayed]

FINDINGS: Single portable view of the chest demonstrates clear lungs. There is
no pleural effusion or pneumothorax. Top-normal cardiac silhouette.
No acute osseous pathology.
IMPRESSION: No active disease.

## 2018-04-18 IMAGING — NM NM MISC PROCEDURE
2 series · 12 of 12 positions shown · non-contrast
Comparison: none

[Series 1: wbr_r-proj_st rest_(id)_sa · 6.5mm · 6.51mm/px · 6 of 64 frames shown]
[frame 6/64]
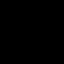
[frame 16/64]
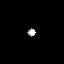
[frame 27/64]
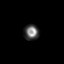
[frame 38/64]
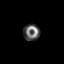
[frame 48/64]
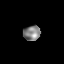
[frame 59/64]
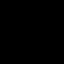

[Series 1: wbr_s-proj_st nongated stress_(id)_sa · 6.5mm · 6.51mm/px · 6 of 64 frames shown]
[frame 6/64]
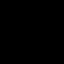
[frame 16/64]
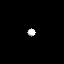
[frame 27/64]
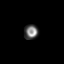
[frame 38/64]
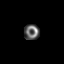
[frame 48/64]
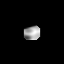
[frame 59/64]
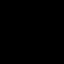

[12 of 12 positions shown; findings below may reference images not displayed]

Canned report from images found in remote index.

Refer to host system for actual result text.

## 2018-05-20 ENCOUNTER — Other Ambulatory Visit: Payer: Self-pay

## 2018-05-20 ENCOUNTER — Telehealth: Payer: Self-pay | Admitting: Cardiovascular Disease

## 2018-05-20 MED ORDER — CARVEDILOL 6.25 MG PO TABS: ORAL_TABLET | ORAL | 7 refills | Status: DC

## 2018-05-20 MED ORDER — FLECAINIDE ACETATE 100 MG PO TABS: 100.0000 mg | ORAL_TABLET | Freq: Two times a day (BID) | ORAL | 7 refills | Status: DC

## 2018-05-20 NOTE — Telephone Encounter (Signed)
No notes that patient should stop those medications, it is due for refill. Sent to pharmacy enough refills until yearly follow up appointment due in April.

## 2018-05-20 NOTE — Telephone Encounter (Signed)
New Message:     Pt wants to know if Dr C wants him t continue ttaking Fleciainide and Carvedilol? If so, please call him medicine to [facility],

## 2018-05-20 NOTE — Telephone Encounter (Signed)
RX sent to pharmacy, no notes that patient should stop any of those medications.

## 2018-06-21 ENCOUNTER — Telehealth: Payer: Self-pay | Admitting: Cardiovascular Disease

## 2018-06-21 MED ORDER — FLECAINIDE ACETATE 100 MG PO TABS: 100.0000 mg | ORAL_TABLET | Freq: Two times a day (BID) | ORAL | 5 refills | Status: DC

## 2018-06-21 MED ORDER — CARVEDILOL 6.25 MG PO TABS: ORAL_TABLET | ORAL | 5 refills | Status: DC

## 2018-06-21 NOTE — Telephone Encounter (Signed)
New message     *STAT* If patient is at the pharmacy, call can be transferred to refill team.   1. Which medications need to be refilled? (please list name of each medication and dose if known)  carvedilol (COREG) 6.25 MG tablet TAKE 1 TABLET (6.25 MG TOTAL) BY MOUTH 2 (TWO) TIMES DAILY.    flecainide (TAMBOCOR) 100 MG tablet Take 1 tablet (100 mg total) by mouth 2 (two) times daily.     2. Which pharmacy/location (including street and city if local pharmacy) is medication to be sent to? [facility]   3. Do they need a 30 day or 90 day supply? 30 day + refills  (so he doesn't have to call every month to get more )

## 2018-07-21 ENCOUNTER — Telehealth: Payer: Self-pay | Admitting: Cardiovascular Disease

## 2018-07-21 MED ORDER — FLECAINIDE ACETATE 100 MG PO TABS: 100.0000 mg | ORAL_TABLET | Freq: Two times a day (BID) | ORAL | 5 refills | Status: DC

## 2018-07-21 NOTE — Telephone Encounter (Signed)
Script sent in as requested ./cy

## 2018-07-21 NOTE — Telephone Encounter (Signed)
°*  STAT* If patient is at the pharmacy, call can be transferred to refill team.   1. Which medications need to be refilled? (please list name of each medication and dose if known)   flecainide (TAMBOCOR) 100 MG tablet  2. Which pharmacy/location (including street and city if local pharmacy) is medication to be sent to?  [id number] - [place], [place] - [address]  3. Do they need a 30 day or 90 day supply?   30 days

## 2018-10-12 ENCOUNTER — Encounter: Payer: Self-pay | Admitting: Cardiology

## 2018-10-18 ENCOUNTER — Encounter: Payer: Self-pay | Admitting: *Deleted

## 2018-10-29 ENCOUNTER — Ambulatory Visit: Payer: BLUE CROSS/BLUE SHIELD | Admitting: Cardiology

## 2018-11-02 ENCOUNTER — Encounter: Payer: Self-pay | Admitting: Cardiology

## 2018-11-02 ENCOUNTER — Ambulatory Visit (INDEPENDENT_AMBULATORY_CARE_PROVIDER_SITE_OTHER): Payer: BLUE CROSS/BLUE SHIELD | Admitting: Cardiology

## 2018-11-02 VITALS — BP 122/66 | HR 56 | Ht 71.0 in | Wt 225.0 lb

## 2018-11-02 DIAGNOSIS — I480 Paroxysmal atrial fibrillation: Secondary | ICD-10-CM

## 2018-11-02 MED ORDER — FLECAINIDE ACETATE 100 MG PO TABS: 100.0000 mg | ORAL_TABLET | Freq: Two times a day (BID) | ORAL | 1 refills | Status: DC | PRN

## 2018-11-02 MED ORDER — CARVEDILOL 6.25 MG PO TABS: 6.2500 mg | ORAL_TABLET | Freq: Two times a day (BID) | ORAL | 1 refills | Status: DC | PRN

## 2018-11-02 NOTE — Addendum Note (Signed)
Addended by: Baird Lyons on: 11/02/2018 10:36 AM   Modules accepted: Orders

## 2018-11-02 NOTE — Progress Notes (Signed)
Electrophysiology Office Note   Date:  11/02/2018   ID:  Mark Ponce, DOB [birth year], MRN [id number]  PCP:  Sigmund Hazel, MD  Cardiologist:  Croitoru Primary Electrophysiologist:  Brenen Beigel [name], MD    No chief complaint on file.    History of Present Illness: Mark Ponce is a 48 y.o. male who was diagnosed persistent atrial fibrillation with early recurrence after initial attempt at elective cardioversion. He was subsequently placed on 50 mg of flecainide with repeat cardioversion. He has maintained normal rhythm. He had return to AF and had a second cardioversion and has maintained SR.  Today, denies symptoms of palpitations, chest pain, shortness of breath, orthopnea, PND, lower extremity edema, claudication, dizziness, presyncope, syncope, bleeding, or neurologic sequela. The patient is tolerating medications without difficulties.  Overall he is feeling well.  He is noted no further episodes of atrial fibrillation since last being seen.  He is able to do all of his daily activities.  He is eating healthier, not eating out as much with much less fast food.  Past Medical History:  Diagnosis Date  . AKI (acute kidney injury) (HCC) 02/2016  . Hypokalemia 02/2016  . New onset atrial fibrillation (HCC) 02/2016   Past Surgical History:  Procedure Laterality Date  . CARDIOVERSION N/A 03/24/2016   Procedure: CARDIOVERSION;  Surgeon: Thurmon Fair, MD;  Location: [facility] ENDOSCOPY;  Service: Cardiovascular;  Laterality: N/A;  . CARDIOVERSION N/A 04/23/2016   Procedure: CARDIOVERSION;  Surgeon: Chilton Si, MD;  Location: [facility] ENDOSCOPY;  Service: Cardiovascular;  Laterality: N/A;  . NO PAST SURGERIES    . TEE WITHOUT CARDIOVERSION N/A 03/24/2016   Procedure: TRANSESOPHAGEAL ECHOCARDIOGRAM (TEE);  Surgeon: Thurmon Fair, MD;  Location: [facility] ENDOSCOPY;  Service: Cardiovascular;  Laterality: N/A;     Current Outpatient Medications  Medication Sig Dispense Refill  . carvedilol  (COREG) 6.25 MG tablet TAKE 1 TABLET (6.25 MG TOTAL) BY MOUTH 2 (TWO) TIMES DAILY. 60 tablet 5  . flecainide (TAMBOCOR) 100 MG tablet Take 1 tablet (100 mg total) by mouth 2 (two) times daily. 60 tablet 5   No current facility-administered medications for this visit.     Allergies:   Patient has no known allergies.   Social History:  The patient  reports that he has never smoked. He has never used smokeless tobacco. He reports that he does not drink alcohol or use drugs.   Family History:  The patient's parents and siblings are healthy without issue.  ROS:  Please see the history of present illness.   Otherwise, review of systems is positive for none.   All other systems are reviewed and negative.   PHYSICAL EXAM: VS:  BP 122/66   Pulse (!) 56   Ht 5\' 11"  (1.803 m)   Wt 225 lb (102.1 kg)   BMI 31.38 kg/m  , BMI Body mass index is 31.38 kg/m. GEN: Well nourished, well developed, in no acute distress  HEENT: normal  Neck: no JVD, carotid bruits, or masses Cardiac: RRR; no murmurs, rubs, or gallops,no edema  Respiratory:  clear to auscultation bilaterally, normal work of breathing GI: soft, nontender, nondistended, + BS MS: no deformity or atrophy  Skin: warm and dry Neuro:  Strength and sensation are intact Psych: euthymic mood, full affect  EKG:  EKG is ordered today. Personal review of the ekg ordered shows sinus rhythm, rate 56, lateral T wave inversions  Recent Labs: No results found for requested labs within last 8760 hours.  Lipid Panel     Component Value Date/Time   CHOL 184 03/20/2016 0404   TRIG 88 03/20/2016 0404   HDL 32 (L) 03/20/2016 0404   CHOLHDL 5.8 03/20/2016 0404   VLDL 18 03/20/2016 0404   LDLCALC 134 (H) 03/20/2016 0404     Wt Readings from Last 3 Encounters:  11/02/18 225 lb (102.1 kg)  12/01/17 216 lb 6.4 oz (98.2 kg)  10/20/17 211 lb (95.7 kg)      Other studies Reviewed: Additional studies/ records that were reviewed today include:  TTE 03/20/16, SPECT 03/31/16  Review of the above records today demonstrates:  - Left ventricle: The cavity size was normal. Wall thickness was   increased in a pattern of mild LVH. Systolic function was normal.   The estimated ejection fraction was in the range of 55% to 60%.   Wall motion was normal; there were no regional wall motion   abnormalities. The study is not technically sufficient to allow   evaluation of LV diastolic function. - Aorta: Aortic root dimension: 39 mm (ED). - Ascending aorta: The ascending aorta was mildly dilated. - Mitral valve: There was mild regurgitation. - Left atrium: The atrium was mildly dilated. Volume/bsa, ES,   (1-plane Simpson&'s, A2C): 36.2 ml/m^2. - Right atrium: The atrium was mildly dilated.   There was no ST segment deviation noted during stress.  This is a low risk study. No perfusion defects, no ischemia. Non-gated images secondary to atrial fibrillation. No ejection fraction calculated.   ASSESSMENT AND PLAN:  1.  Persistent atrial fibrillation: He is on carvedilol and flecainide.  He currently feels well and has had minimal atrial fibrillation over the last year.  He would like to try and get off of some of his medications.  I have told him that it is okay to take these medications on an as-needed basis.    This patients CHA2DS2-VASc Score and unadjusted Ischemic Stroke Rate (% per year) is equal to 0.6 % stroke rate/year from a score of 1  Above score calculated as 1 point each if present [CHF, HTN, DM, Vascular=MI/PAD/Aortic Plaque, Age if 65-74, or Male] Above score calculated as 2 points each if present [Age > 75, or Stroke/TIA/TE]   2. Hypertension: Well-controlled today.  He would like to try being off of some of his medications.  We Usher Hedberg stop carvedilol and flecainide and he Ashaad Gaertner take them on an as-needed basis.  Should his blood pressure go back up, would restart carvedilol.    Current medicines are reviewed at length with the  patient today.   The patient does not have concerns regarding his medicines.  The following changes were made today: Change flecainide and carvedilol to as needed  Labs/ tests ordered today include:  Orders Placed This Encounter  Procedures  . EKG 12-Lead     Disposition:   FU with Jairen Goldfarb 12 months  Signed, Corinna Burkman [name], MD  11/02/2018 9:25 AM     [facility] [address] [place] [address] [phone] (office) [phone] (fax)

## 2018-11-02 NOTE — Patient Instructions (Addendum)
Medication Instructions:  Your physician has recommended you make the following change in your medication:  Change the way you take the following medications: 1. Carvedilol -  You can take this twice daily as needed for elevated heart rates 2. Flecainide - You can take this daily as needed for elevated heart rates/AFib.  * If you need a refill on your cardiac medications before your next appointment, please call your pharmacy.   Labwork: None ordered  Testing/Procedures: None ordered  Follow-Up: Your physician wants you to follow-up in: 1 year with Dr. Elberta Fortis.  You will receive a reminder letter in the mail two months in advance. If you don't receive a letter, please call our office to schedule the follow-up appointment.  Thank you for choosing [facility]!!   Dory Horn, RN [phone]

## 2019-12-02 ENCOUNTER — Telehealth: Payer: Self-pay | Admitting: Cardiology

## 2019-12-02 NOTE — Telephone Encounter (Signed)
New message ° °We are recommending the COVID-19 vaccine to all of our patients. Cardiac medications (including blood thinners) should not deter anyone from being vaccinated and there is no need to hold any of those medications prior to vaccine administration.  °  ° °Currently, there is a hotline to call (active 09/02/19) to schedule vaccination appointments as no walk-ins will be accepted.  ° °Number: [phone].   ° °If an appointment is not available please go to [email or web address] to sign up for notification when additional vaccine appointments are available. ° ° °If you have further questions or concerns about the vaccine process, please visit [email or web address] or contact your primary care physician. ° ° °

## 2020-04-19 ENCOUNTER — Encounter: Payer: Self-pay | Admitting: Cardiology

## 2020-04-19 ENCOUNTER — Ambulatory Visit (INDEPENDENT_AMBULATORY_CARE_PROVIDER_SITE_OTHER): Payer: BC Managed Care – PPO | Admitting: Cardiology

## 2020-04-19 ENCOUNTER — Other Ambulatory Visit: Payer: Self-pay

## 2020-04-19 VITALS — BP 116/68 | HR 66 | Ht 71.0 in | Wt 224.0 lb

## 2020-04-19 DIAGNOSIS — I4819 Other persistent atrial fibrillation: Secondary | ICD-10-CM

## 2020-04-19 NOTE — Patient Instructions (Signed)
Medication Instructions:  Your physician recommends that you continue on your current medications as directed. Please refer to the Current Medication list given to you today.  *If you need a refill on your cardiac medications before your next appointment, please call your pharmacy*   Lab Work: None ordered If you have labs (blood work) drawn today and your tests are completely normal, you will receive your results only by: Marland Kitchen MyChart Message (if you have MyChart) OR . A paper copy in the mail If you have any lab test that is abnormal or we need to change your treatment, we will call you to review the results.   Testing/Procedures: None ordered   Follow-Up: At [facility], you and your health needs are our priority.  As part of our continuing mission to provide you with exceptional heart care, we have created designated Provider Care Teams.  These Care Teams include your primary Cardiologist (physician) and Advanced Practice Providers (APPs -  Physician Assistants and Nurse Practitioners) who all work together to provide you with the care you need, when you need it.  We recommend signing up for the patient portal called "MyChart".  Sign up information is provided on this After Visit Summary.  MyChart is used to connect with patients for Virtual Visits (Telemedicine).  Patients are able to view lab/test results, encounter notes, upcoming appointments, etc.  Non-urgent messages can be sent to your provider as well.   To learn more about what you can do with MyChart, go to [email or web address].    Your next appointment:   1 year(s)  The format for your next appointment:   In Person  Provider:   Loman Brooklyn, MD   Thank you for choosing [facility]!!   Dory Horn, RN [phone]    Other Instructions  [employer]  FDA-cleared EKG at your fingertips. - [employer]   [employer]. https://store.alivecor.com/products/[email or web address]   FDA-cleared,  clinical grade mobile EKG monitor: Lourena Simmonds is the most clinically-validated mobile EKG used by the world's leading cardiac care medical professionals.  This may be useful in monitoring palpitations.  We do not have access to have them emailed and reviewed but will be glad to review while in the office.

## 2020-04-19 NOTE — Progress Notes (Signed)
Electrophysiology Office Note   Date:  04/19/2020   ID:  Mark Ponce, DOB 08/29/1970, MRN [id number]  PCP:  Mark Hazel, MD  Cardiologist:  Mark Ponce Primary Electrophysiologist:  Mark Ellingwood [name], MD    No chief complaint on file.    History of Present Illness: Mark Ponce is a 49 y.o. male who was diagnosed persistent atrial fibrillation with early recurrence after initial attempt at elective cardioversion. He was subsequently placed on 50 mg of flecainide with repeat cardioversion. He has maintained normal rhythm. He had return to AF and had a second cardioversion and has maintained SR.  Today, denies symptoms of palpitations, chest pain, shortness of breath, orthopnea, PND, lower extremity edema, claudication, dizziness, presyncope, syncope, bleeding, or neurologic sequela. The patient is tolerating medications without difficulties. He feels well today. He has no chest pain or shortness of breath and is able to do all of his daily activities. He is started eating healthier and exercising. He has lost some weight over the last few months. He did notice some palpitations a few months ago while he was sleeping that woke him from sleep. Aside from that he has noted no significant issues.  Past Medical History:  Diagnosis Date  . AKI (acute kidney injury) (HCC) 02/2016  . Hypokalemia 02/2016  . New onset atrial fibrillation (HCC) 02/2016   Past Surgical History:  Procedure Laterality Date  . CARDIOVERSION N/A 03/24/2016   Procedure: CARDIOVERSION;  Surgeon: Mark Fair, MD;  Location: [facility] ENDOSCOPY;  Service: Cardiovascular;  Laterality: N/A;  . CARDIOVERSION N/A 04/23/2016   Procedure: CARDIOVERSION;  Surgeon: Mark Si, MD;  Location: [facility] ENDOSCOPY;  Service: Cardiovascular;  Laterality: N/A;  . NO PAST SURGERIES    . TEE WITHOUT CARDIOVERSION N/A 03/24/2016   Procedure: TRANSESOPHAGEAL ECHOCARDIOGRAM (TEE);  Surgeon: Mark Fair, MD;  Location: [facility] ENDOSCOPY;   Service: Cardiovascular;  Laterality: N/A;     No current outpatient medications on file.   No current facility-administered medications for this visit.    Allergies:   Patient has no known allergies.   Social History:  The patient  reports that he has never smoked. He has never used smokeless tobacco. He reports that he does not drink alcohol and does not use drugs.   Family History:  The patient's parents and siblings are healthy without issue.  ROS:  Please see the history of present illness.   Otherwise, review of systems is positive for none.   All other systems are reviewed and negative.   PHYSICAL EXAM: VS:  BP 116/68   Pulse 66   Ht 5\' 11"  (1.803 m)   Wt 224 lb (101.6 kg)   SpO2 99%   BMI 31.24 kg/m  , BMI Body mass index is 31.24 kg/m. GEN: Well nourished, well developed, in no acute distress  HEENT: normal  Neck: no JVD, carotid bruits, or masses Cardiac: RRR; no murmurs, rubs, or gallops,no edema  Respiratory:  clear to auscultation bilaterally, normal work of breathing GI: soft, nontender, nondistended, + BS MS: no deformity or atrophy  Skin: warm and dry Neuro:  Strength and sensation are intact Psych: euthymic mood, full affect  EKG:  EKG is  ordered today. Personal review of the ekg ordered shows sinus rhythm, rate 66  Recent Labs: No results found for requested labs within last 8760 hours.    Lipid Panel     Component Value Date/Time   CHOL 184 03/20/2016 0404   TRIG 88 03/20/2016 0404   HDL  32 (L) 03/20/2016 0404   CHOLHDL 5.8 03/20/2016 0404   VLDL 18 03/20/2016 0404   LDLCALC 134 (H) 03/20/2016 0404     Wt Readings from Last 3 Encounters:  04/19/20 224 lb (101.6 kg)  11/02/18 225 lb (102.1 kg)  12/01/17 216 lb 6.4 oz (98.2 kg)      Other studies Reviewed: Additional studies/ records that were reviewed today include: TTE 03/20/16, SPECT 03/31/16  Review of the above records today demonstrates:  - Left ventricle: The cavity size was  normal. Wall thickness was   increased in a pattern of mild LVH. Systolic function was normal.   The estimated ejection fraction was in the range of 55% to 60%.   Wall motion was normal; there were no regional wall motion   abnormalities. The study is not technically sufficient to allow   evaluation of LV diastolic function. - Aorta: Aortic root dimension: 39 mm (ED). - Ascending aorta: The ascending aorta was mildly dilated. - Mitral valve: There was mild regurgitation. - Left atrium: The atrium was mildly dilated. Volume/bsa, ES,   (1-plane Simpson&'s, A2C): 36.2 ml/m^2. - Right atrium: The atrium was mildly dilated.   There was no ST segment deviation noted during stress.  This is a low risk study. No perfusion defects, no ischemia. Non-gated images secondary to atrial fibrillation. No ejection fraction calculated.   ASSESSMENT AND PLAN:  1.  Persistent atrial fibrillation: Currently not anticoagulated due to his low stroke risk.  Remains in sinus rhythm.  Not on any antiarrhythmics. He has not had any obvious episodes of atrial fibrillation. He has had some intermittent palpitations. Due to that I have told him to get the alive core app to monitor his rhythm. I Mark Ponce send in tracings if he is concerned.   2. Hypertension: Currently well controlled off of medications    Current medicines are reviewed at length with the patient today.   The patient does not have concerns regarding his medicines.  The following changes were made today: None  Labs/ tests ordered today include:  Orders Placed This Encounter  Procedures  . EKG 12-Lead     Disposition:   FU with Mark Ponce 12 months  Signed, Mark Harig [name], MD  04/19/2020 11:44 AM     [facility] [address] [place] [address] [phone] (office) [phone] (fax)

## 2021-09-12 DIAGNOSIS — Z13220 Encounter for screening for lipoid disorders: Secondary | ICD-10-CM | POA: Diagnosis not present

## 2021-09-12 DIAGNOSIS — Z0000 Encounter for general adult medical examination without abnormal findings: Secondary | ICD-10-CM | POA: Diagnosis not present

## 2021-10-04 DIAGNOSIS — H16002 Unspecified corneal ulcer, left eye: Secondary | ICD-10-CM | POA: Diagnosis not present

## 2022-01-16 ENCOUNTER — Ambulatory Visit: Payer: 59 | Admitting: Cardiology

## 2022-01-16 ENCOUNTER — Encounter: Payer: Self-pay | Admitting: *Deleted

## 2022-01-16 ENCOUNTER — Encounter: Payer: Self-pay | Admitting: Cardiology

## 2022-01-16 VITALS — BP 132/84 | HR 78 | Ht 71.0 in | Wt 209.0 lb

## 2022-01-16 DIAGNOSIS — I4819 Other persistent atrial fibrillation: Secondary | ICD-10-CM

## 2022-01-16 DIAGNOSIS — Z79899 Other long term (current) drug therapy: Secondary | ICD-10-CM

## 2022-01-16 DIAGNOSIS — Z01812 Encounter for preprocedural laboratory examination: Secondary | ICD-10-CM

## 2022-01-16 MED ORDER — APIXABAN 5 MG PO TABS: 5.0000 mg | ORAL_TABLET | Freq: Two times a day (BID) | ORAL | 3 refills | Status: DC

## 2022-01-16 NOTE — Progress Notes (Signed)
Electrophysiology Office Note   Date:  01/16/2022   ID:  Mark Ponce, DOB [birth year], MRN [id number]  PCP:  Sigmund Hazel, MD  Cardiologist:  Croitoru Primary Electrophysiologist:  Aniza Shor [name], MD    No chief complaint on file.    History of Present Illness: Mark Ponce is a 51 y.o. male who presents today for electrophysiology evaluation.  He has a history of persistent atrial fibrillation.  He had an attempted cardioversion with early recurrence after his initial attempt.  He was started on flecainide.  Morning  Today, denies symptoms of palpitations, chest pain, shortness of breath, orthopnea, PND, lower extremity edema, claudication, dizziness, presyncope, syncope, bleeding, or neurologic sequela. The patient is tolerating medications without difficulties.  Unfortunately he has gone back into atrial fibrillation.  He has been in atrial fibrillation for at least 2 months.  He does not have much weakness and fatigue, but he would like to get back into normal rhythm.  He does not want to be in atrial fibrillation long-term.  Past Medical History:  Diagnosis Date   AKI (acute kidney injury) (HCC) 02/2016   Hypokalemia 02/2016   New onset atrial fibrillation (HCC) 02/2016   Past Surgical History:  Procedure Laterality Date   CARDIOVERSION N/A 03/24/2016   Procedure: CARDIOVERSION;  Surgeon: Thurmon Fair, MD;  Location: [facility] ENDOSCOPY;  Service: Cardiovascular;  Laterality: N/A;   CARDIOVERSION N/A 04/23/2016   Procedure: CARDIOVERSION;  Surgeon: Chilton Si, MD;  Location: [facility] ENDOSCOPY;  Service: Cardiovascular;  Laterality: N/A;   NO PAST SURGERIES     TEE WITHOUT CARDIOVERSION N/A 03/24/2016   Procedure: TRANSESOPHAGEAL ECHOCARDIOGRAM (TEE);  Surgeon: Thurmon Fair, MD;  Location: [facility] ENDOSCOPY;  Service: Cardiovascular;  Laterality: N/A;     Current Outpatient Medications  Medication Sig Dispense Refill   [START ON 03/31/2022] apixaban (ELIQUIS) 5 MG TABS  tablet Take 1 tablet (5 mg total) by mouth 2 (two) times daily. 60 tablet 3   No current facility-administered medications for this visit.    Allergies:   Patient has no known allergies.   Social History:  The patient  reports that he has never smoked. He has never used smokeless tobacco. He reports that he does not drink alcohol and does not use drugs.   Family History:  The patient's parents and siblings are healthy without issue.  ROS:  Please see the history of present illness.   Otherwise, review of systems is positive for none.   All other systems are reviewed and negative.   PHYSICAL EXAM: VS:  BP 132/84   Pulse 78   Ht 5\' 11"  (1.803 m)   Wt 209 lb (94.8 kg)   SpO2 96%   BMI 29.15 kg/m  , BMI Body mass index is 29.15 kg/m. GEN: Well nourished, well developed, in no acute distress  HEENT: normal  Neck: no JVD, carotid bruits, or masses Cardiac: irregular; no murmurs, rubs, or gallops,no edema  Respiratory:  clear to auscultation bilaterally, normal work of breathing GI: soft, nontender, nondistended, + BS MS: no deformity or atrophy  Skin: warm and dry Neuro:  Strength and sensation are intact Psych: euthymic mood, full affect  EKG:  EKG is ordered today. Personal review of the ekg ordered shows atrial fibrillation, rate 78   Recent Labs: No results found for requested labs within last 8760 hours.    Lipid Panel     Component Value Date/Time   CHOL 184 03/20/2016 0404   TRIG 88 03/20/2016 0404  HDL 32 (L) 03/20/2016 0404   CHOLHDL 5.8 03/20/2016 0404   VLDL 18 03/20/2016 0404   LDLCALC 134 (H) 03/20/2016 0404     Wt Readings from Last 3 Encounters:  01/16/22 209 lb (94.8 kg)  04/19/20 224 lb (101.6 kg)  11/02/18 225 lb (102.1 kg)      Other studies Reviewed: Additional studies/ records that were reviewed today include: TTE 03/20/16, SPECT 03/31/16  Review of the above records today demonstrates:  - Left ventricle: The cavity size was normal. Wall  thickness was   increased in a pattern of mild LVH. Systolic function was normal.   The estimated ejection fraction was in the range of 55% to 60%.   Wall motion was normal; there were no regional wall motion   abnormalities. The study is not technically sufficient to allow   evaluation of LV diastolic function. - Aorta: Aortic root dimension: 39 mm (ED). - Ascending aorta: The ascending aorta was mildly dilated. - Mitral valve: There was mild regurgitation. - Left atrium: The atrium was mildly dilated. Volume/bsa, ES,   (1-plane Simpson&'s, A2C): 36.2 ml/m^2. - Right atrium: The atrium was mildly dilated.  There was no ST segment deviation noted during stress. This is a low risk study. No perfusion defects, no ischemia. Non-gated images secondary to atrial fibrillation. No ejection fraction calculated.   ASSESSMENT AND PLAN:  1.  Persistent atrial fibrillation: Currently not anticoagulated due to a low stroke risk.  CHA2DS2-VASc of 0.  Unfortunately is gone back into atrial fibrillation.  He potentially has some mild fatigue, but otherwise is minimally symptomatic.  Despite that, he would like to get back into normal rhythm.  He would prefer to avoid medications.  Due to that, we Shimshon Narula plan for ablation.  We Kholton Coate start him on Eliquis for 1 month prior to the ablation procedure.  Risk, benefits, and alternatives to EP study and radiofrequency ablation for afib were also discussed in detail today. These risks include but are not limited to stroke, bleeding, vascular damage, tamponade, perforation, damage to the esophagus, lungs, and other structures, pulmonary vein stenosis, worsening renal function, and death. The patient understands these risk and wishes to proceed.  We Vyron Fronczak therefore proceed with catheter ablation at the next available time.  Carto, ICE, anesthesia are requested for the procedure.  Rebel Willcutt also obtain CT PV protocol prior to the procedure to exclude LAA thrombus and further  evaluate atrial anatomy.   Current medicines are reviewed at length with the patient today.   The patient does not have concerns regarding his medicines.  The following changes were made today: Eliquis  Labs/ tests ordered today include:  Orders Placed This Encounter  Procedures   CT CARDIAC MORPH/PULM VEIN W/CM&W/O CA SCORE   Basic metabolic panel   CBC   Basic metabolic panel   CBC   EKG 12-Lead     Disposition:   FU with Earnstine Meinders 3 months  Signed, Wilton Thrall [name], MD  01/16/2022 9:48 AM     [facility] [address] [place] [address] [phone] (office) [phone] (fax)

## 2022-01-16 NOTE — Patient Instructions (Addendum)
Medication Instructions:  Your physician has recommended you make the following change in your medication:  START Eliquis 5 mg TWICE daily -- you will start this on 03/31/22  *If you need a refill on your cardiac medications before your next appointment, please call your pharmacy*   Lab Work: Today:  BMP & CBC  If you have labs (blood work) drawn today and your tests are completely normal, you will receive your results only by: MyChart Message (if you have MyChart) OR A paper copy in the mail If you have any lab test that is abnormal or we need to change your treatment, we will call you to review the results.   Testing/Procedures: Your physician has requested that you have cardiac CT within 7 days PRIOR to your ablation. Cardiac computed tomography (CT) is a painless test that uses an x-ray machine to take clear, detailed pictures of your heart.  Please follow instruction below located under "other instructions". You will get a call from our office to schedule the date for this test.  Your physician has recommended that you have an ablation. Catheter ablation is a medical procedure used to treat some cardiac arrhythmias (irregular heartbeats). During catheter ablation, a long, thin, flexible tube is put into a blood vessel in your groin (upper thigh), or neck. This tube is called an ablation catheter. It is then guided to your heart through the blood vessel. Radio frequency waves destroy small areas of heart tissue where abnormal heartbeats may cause an arrhythmia to start. Please follow instruction letter given to you today.   Follow-Up: At [facility], you and your health needs are our priority.  As part of our continuing mission to provide you with exceptional heart care, we have created designated Provider Care Teams.  These Care Teams include your primary Cardiologist (physician) and Advanced Practice Providers (APPs -  Physician Assistants and Nurse Practitioners) who all work  together to provide you with the care you need, when you need it.  We recommend signing up for the patient portal called "MyChart".  Sign up information is provided on this After Visit Summary.  MyChart is used to connect with patients for Virtual Visits (Telemedicine).  Patients are able to view lab/test results, encounter notes, upcoming appointments, etc.  Non-urgent messages can be sent to your provider as well.   To learn more about what you can do with MyChart, go to [email or web address].    Your next appointment:   1 month(s) after your ablation  The format for your next appointment:   In Person  Provider:   [facility] clinic   Thank you for choosing [facility]!!   Dory Horn, RN [phone]    Other Instructions  Cardiac Ablation Cardiac ablation is a procedure to destroy (ablate) some heart tissue that is sending bad signals. These bad signals cause problems in heart rhythm. The heart has many areas that make these signals. If there are problems in these areas, they can make the heart beat in a way that is not normal. Destroying some tissues can help make the heart rhythm normal. Tell your doctor about: Any allergies you have. All medicines you are taking. These include vitamins, herbs, eye drops, creams, and over-the-counter medicines. Any problems you or family members have had with medicines that make you fall asleep (anesthetics). Any blood disorders you have. Any surgeries you have had. Any medical conditions you have, such as kidney failure. Whether you are pregnant or may be pregnant. What are the risks?  This is a safe procedure. But problems may occur, including: Infection. Bruising and bleeding. Bleeding into the chest. Stroke or blood clots. Damage to nearby areas of your body. Allergies to medicines or dyes. The need for a pacemaker if the normal system is damaged. Failure of the procedure to treat the problem. What happens before the  procedure? Medicines Ask your doctor about: Changing or stopping your normal medicines. This is important. Taking aspirin and ibuprofen. Do not take these medicines unless your doctor tells you to take them. Taking other medicines, vitamins, herbs, and supplements. General instructions Follow instructions from your doctor about what you cannot eat or drink. Plan to have someone take you home from the hospital or clinic. If you will be going home right after the procedure, plan to have someone with you for 24 hours. Ask your doctor what steps will be taken to prevent infection. What happens during the procedure?  An IV tube will be put into one of your veins. You will be given a medicine to help you relax. The skin on your neck or groin will be numbed. A cut (incision) will be made in your neck or groin. A needle will be put through your cut and into a large vein. A tube (catheter) will be put into the needle. The tube will be moved to your heart. Dye may be put through the tube. This helps your doctor see your heart. Small devices (electrodes) on the tube will send out signals. A type of energy will be used to destroy some heart tissue. The tube will be taken out. Pressure will be held on your cut. This helps stop bleeding. A bandage will be put over your cut. The exact procedure may vary among doctors and hospitals. What happens after the procedure? You will be watched until you leave the hospital or clinic. This includes checking your heart rate, breathing rate, oxygen, and blood pressure. Your cut will be watched for bleeding. You will need to lie still for a few hours. Do not drive for 24 hours or as long as your doctor tells you. Summary Cardiac ablation is a procedure to destroy some heart tissue. This is done to treat heart rhythm problems. Tell your doctor about any medical conditions you may have. Tell him or her about all medicines you are taking to treat them. This is a  safe procedure. But problems may occur. These include infection, bruising, bleeding, and damage to nearby areas of your body. Follow what your doctor tells you about food and drink. You may also be told to change or stop some of your medicines. After the procedure, do not drive for 24 hours or as long as your doctor tells you. This information is not intended to replace advice given to you by your health care provider. Make sure you discuss any questions you have with your health care provider. Document Revised: 07/14/2019 Document Reviewed: 07/14/2019 Elsevier Patient Education  2023 [employer].

## 2022-03-13 DIAGNOSIS — I10 Essential (primary) hypertension: Secondary | ICD-10-CM | POA: Diagnosis not present

## 2022-03-13 DIAGNOSIS — T63481S Toxic effect of venom of other arthropod, accidental (unintentional), sequela: Secondary | ICD-10-CM | POA: Diagnosis not present

## 2022-04-21 ENCOUNTER — Other Ambulatory Visit: Payer: 59

## 2022-04-24 ENCOUNTER — Other Ambulatory Visit (HOSPITAL_COMMUNITY): Payer: 59

## 2022-05-08 DIAGNOSIS — D696 Thrombocytopenia, unspecified: Secondary | ICD-10-CM | POA: Diagnosis not present

## 2022-05-08 DIAGNOSIS — R945 Abnormal results of liver function studies: Secondary | ICD-10-CM | POA: Diagnosis not present

## 2022-05-08 DIAGNOSIS — I4811 Longstanding persistent atrial fibrillation: Secondary | ICD-10-CM | POA: Diagnosis not present

## 2022-05-08 DIAGNOSIS — I10 Essential (primary) hypertension: Secondary | ICD-10-CM | POA: Diagnosis not present

## 2022-05-27 DIAGNOSIS — Z1211 Encounter for screening for malignant neoplasm of colon: Secondary | ICD-10-CM | POA: Diagnosis not present

## 2022-05-27 DIAGNOSIS — Z1212 Encounter for screening for malignant neoplasm of rectum: Secondary | ICD-10-CM | POA: Diagnosis not present

## 2022-05-29 ENCOUNTER — Ambulatory Visit (HOSPITAL_COMMUNITY): Payer: 59 | Admitting: Nurse Practitioner

## 2022-07-31 ENCOUNTER — Encounter: Payer: Self-pay | Admitting: Cardiology

## 2022-07-31 ENCOUNTER — Ambulatory Visit: Payer: 59 | Attending: Cardiology | Admitting: Cardiology

## 2022-07-31 VITALS — BP 134/90 | HR 59 | Ht 71.0 in | Wt 209.0 lb

## 2022-07-31 DIAGNOSIS — D6869 Other thrombophilia: Secondary | ICD-10-CM | POA: Diagnosis not present

## 2022-07-31 DIAGNOSIS — Z79899 Other long term (current) drug therapy: Secondary | ICD-10-CM

## 2022-07-31 DIAGNOSIS — I4819 Other persistent atrial fibrillation: Secondary | ICD-10-CM

## 2022-07-31 NOTE — Progress Notes (Signed)
Electrophysiology Office Note   Date:  07/31/2022   ID:  Mark Ponce, DOB August 30, 1970, MRN [id number]  PCP:  Sigmund Hazel, MD  Cardiologist:  [place] Primary Electrophysiologist:  Naylene Foell [name], MD    No chief complaint on file.    History of Present Illness: Mark Ponce is a 51 y.o. male who presents today for electrophysiology evaluation.    Has a history of symptomatic persistent atrial fibrillation.  He had attempted cardioversion with early recurrence of atrial fibrillation after the initial attempt.  He was started on flecainide.  He has now had plans for ablation on 08/29/2022.  Today, denies symptoms of palpitations, chest pain, shortness of breath, orthopnea, PND, lower extremity edema, claudication, dizziness, presyncope, syncope, bleeding, or neurologic sequela. The patient is tolerating medications without difficulties.  He feels well.  He has plans for ablation upcoming.  He continues to have mild fatigue, but is ready for his ablation.  Past Medical History:  Diagnosis Date   AKI (acute kidney injury) (HCC) 02/2016   Hypokalemia 02/2016   New onset atrial fibrillation (HCC) 02/2016   Past Surgical History:  Procedure Laterality Date   CARDIOVERSION N/A 03/24/2016   Procedure: CARDIOVERSION;  Surgeon: Thurmon Fair, MD;  Location: [facility] ENDOSCOPY;  Service: Cardiovascular;  Laterality: N/A;   CARDIOVERSION N/A 04/23/2016   Procedure: CARDIOVERSION;  Surgeon: Chilton Si, MD;  Location: [facility] ENDOSCOPY;  Service: Cardiovascular;  Laterality: N/A;   NO PAST SURGERIES     TEE WITHOUT CARDIOVERSION N/A 03/24/2016   Procedure: TRANSESOPHAGEAL ECHOCARDIOGRAM (TEE);  Surgeon: Thurmon Fair, MD;  Location: [facility] ENDOSCOPY;  Service: Cardiovascular;  Laterality: N/A;     Current Outpatient Medications  Medication Sig Dispense Refill   carvedilol (COREG) 3.125 MG tablet Take 3.125 mg by mouth 2 (two) times daily.     apixaban (ELIQUIS) 5 MG TABS tablet Take 1  tablet (5 mg total) by mouth 2 (two) times daily. 60 tablet 3   No current facility-administered medications for this visit.    Allergies:   Patient has no known allergies.   Social History:  The patient  reports that he has never smoked. He has never used smokeless tobacco. He reports that he does not drink alcohol and does not use drugs.   Family History:  The patient's parents and siblings are healthy without issue.  ROS:  Please see the history of present illness.   Otherwise, review of systems is positive for none.   All other systems are reviewed and negative.   PHYSICAL EXAM: VS:  BP (!) 134/90   Pulse (!) 59   Ht 5\' 11"  (1.803 m)   Wt 209 lb (94.8 kg)   SpO2 94%   BMI 29.15 kg/m  , BMI Body mass index is 29.15 kg/m. GEN: Well nourished, well developed, in no acute distress  HEENT: normal  Neck: no JVD, carotid bruits, or masses Cardiac: irregular; no murmurs, rubs, or gallops,no edema  Respiratory:  clear to auscultation bilaterally, normal work of breathing GI: soft, nontender, nondistended, + BS MS: no deformity or atrophy  Skin: warm and dry Neuro:  Strength and sensation are intact Psych: euthymic mood, full affect  EKG:  EKG is ordered today. Personal review of the ekg ordered shows AF   Recent Labs: No results found for requested labs within last 365 days.    Lipid Panel     Component Value Date/Time   CHOL 184 03/20/2016 0404   TRIG 88 03/20/2016 0404  HDL 32 (L) 03/20/2016 0404   CHOLHDL 5.8 03/20/2016 0404   VLDL 18 03/20/2016 0404   LDLCALC 134 (H) 03/20/2016 0404     Wt Readings from Last 3 Encounters:  07/31/22 209 lb (94.8 kg)  01/16/22 209 lb (94.8 kg)  04/19/20 224 lb (101.6 kg)      Other studies Reviewed: Additional studies/ records that were reviewed today include: TTE 03/20/16, SPECT 03/31/16  Review of the above records today demonstrates:  - Left ventricle: The cavity size was normal. Wall thickness was   increased in a pattern  of mild LVH. Systolic function was normal.   The estimated ejection fraction was in the range of 55% to 60%.   Wall motion was normal; there were no regional wall motion   abnormalities. The study is not technically sufficient to allow   evaluation of LV diastolic function. - Aorta: Aortic root dimension: 39 mm (ED). - Ascending aorta: The ascending aorta was mildly dilated. - Mitral valve: There was mild regurgitation. - Left atrium: The atrium was mildly dilated. Volume/bsa, ES,   (1-plane Simpson&'s, A2C): 36.2 ml/m^2. - Right atrium: The atrium was mildly dilated.  There was no ST segment deviation noted during stress. This is a low risk study. No perfusion defects, no ischemia. Non-gated images secondary to atrial fibrillation. No ejection fraction calculated.   ASSESSMENT AND PLAN:  1.  Persistent atrial fibrillation: CHA2DS2-VASc of 0.  Plan for ablation 08/29/2022.  [name] start his Eliquis today.  [name] be on 5 mg twice daily.  He is on carvedilol 3.125 mg twice daily for rate control.  He is overall feeling well.  Ready for ablation.  All questions were answered today.  2.  Secondary hypercoagulable state: Currently on Eliquis for atrial fibrillation as above  Current medicines are reviewed at length with the patient today.   The patient does not have concerns regarding his medicines.  The following changes were made today: None  Labs/ tests ordered today include:  Orders Placed This Encounter  Procedures   Basic metabolic panel   CBC   EKG 12-Lead     Disposition:   FU 4 months  Signed, Mark Vanzile [name], MD  07/31/2022 11:22 AM     [facility] [address] [place] [address] [phone] (office) [phone] (fax)

## 2022-07-31 NOTE — Patient Instructions (Signed)
Medication Instructions:  Your physician recommends that you continue on your current medications as directed. Please refer to the Current Medication list given to you today.  *If you need a refill on your cardiac medications before your next appointment, please call your pharmacy*   Lab Work: Pre procedure labs today: BMET & CBC  If you have labs (blood work) drawn today and your tests are completely normal, you will receive your results only by: MyChart Message (if you have MyChart) OR A paper copy in the mail If you have any lab test that is abnormal or we need to change your treatment, we will call you to review the results.   Testing/Procedures: Will send your CT and ablation instructions via mychart   Follow-Up: At [facility], you and your health needs are our priority.  As part of our continuing mission to provide you with exceptional heart care, we have created designated Provider Care Teams.  These Care Teams include your primary Cardiologist (physician) and Advanced Practice Providers (APPs -  Physician Assistants and Nurse Practitioners) who all work together to provide you with the care you need, when you need it.  Your next appointment:   1 month(s) after your ablation  The format for your next appointment:   In Person  Provider:   You will follow up in the Atrial Fibrillation Clinic located at [facility] [place] Your provider will be: Clint R. Fenton, PA-C    Thank you for choosing [facility]!!   Dory Horn, RN [phone]  Other Instructions    Important Information About Sugar

## 2022-08-08 ENCOUNTER — Telehealth: Payer: Self-pay

## 2022-08-08 DIAGNOSIS — I4819 Other persistent atrial fibrillation: Secondary | ICD-10-CM

## 2022-08-08 NOTE — Telephone Encounter (Signed)
Work up complete for procedure.   Pt did not have his labs done when he was supposed to on 12/7 so he will come by the office one day before his CT Scan to have them done.   He is aware of date/time and Instructions will be sent via MyChart.Marland KitchenMarland Kitchen

## 2022-08-13 ENCOUNTER — Ambulatory Visit: Payer: 59 | Attending: Cardiology

## 2022-08-13 DIAGNOSIS — I4819 Other persistent atrial fibrillation: Secondary | ICD-10-CM

## 2022-08-14 LAB — BASIC METABOLIC PANEL
BUN/Creatinine Ratio: 10 (ref 9–20)
BUN: 14 mg/dL (ref 6–24)
CO2: 24 mmol/L (ref 20–29)
Calcium: 9.8 mg/dL (ref 8.7–10.2)
Chloride: 105 mmol/L (ref 96–106)
Creatinine, Ser: 1.39 mg/dL — ABNORMAL HIGH (ref 0.76–1.27)
Glucose: 94 mg/dL (ref 70–99)
Potassium: 4.4 mmol/L (ref 3.5–5.2)
Sodium: 141 mmol/L (ref 134–144)
eGFR: 61 mL/min/{1.73_m2} (ref >59)

## 2022-08-14 LAB — CBC
Hematocrit: 45.7 % (ref 37.5–51.0)
Hemoglobin: 15.3 g/dL (ref 13.0–17.7)
MCH: 30.7 pg (ref 26.6–33.0)
MCHC: 33.5 g/dL (ref 31.5–35.7)
MCV: 92 fL (ref 79–97)
Platelets: 153 10*3/uL (ref 150–450)
RBC: 4.98 x10E6/uL (ref 4.14–5.80)
RDW: 12.7 % (ref 11.6–15.4)
WBC: 9.4 10*3/uL (ref 3.4–10.8)

## 2022-08-20 ENCOUNTER — Telehealth (HOSPITAL_COMMUNITY): Payer: Self-pay | Admitting: Emergency Medicine

## 2022-08-20 NOTE — Telephone Encounter (Signed)
Attempted to call patient regarding upcoming cardiac CT appointment. °Left message on voicemail with name and callback number °Thang Flett RN Navigator Cardiac Imaging °[facility] °[phone] Office °[phone] Cell ° °

## 2022-08-21 ENCOUNTER — Ambulatory Visit
Admission: RE | Admit: 2022-08-21 | Discharge: 2022-08-21 | Disposition: A | Discharge status: Home / Self Care | Payer: 59 | Source: Ambulatory Visit | Attending: Cardiology | Admitting: Cardiology

## 2022-08-21 ENCOUNTER — Other Ambulatory Visit (HOSPITAL_BASED_OUTPATIENT_CLINIC_OR_DEPARTMENT_OTHER): Payer: 59

## 2022-08-21 DIAGNOSIS — I4819 Other persistent atrial fibrillation: Secondary | ICD-10-CM | POA: Diagnosis not present

## 2022-08-21 MED ORDER — IOHEXOL 350 MG/ML SOLN
75.0000 mL | Freq: Once | INTRAVENOUS | Status: AC | PRN
  Administered 2022-08-21: 100 mL via INTRAVENOUS

## 2022-08-26 ENCOUNTER — Telehealth: Payer: Self-pay | Admitting: Cardiology

## 2022-08-26 NOTE — Telephone Encounter (Signed)
Pt aware that we sent message to precert on 79/02. Advised to contact insurance to confirm if approved. Patient verbalized understanding and agreeable to plan.

## 2022-08-26 NOTE — Telephone Encounter (Signed)
Patient calling to see if a claim had been filed for his ablation procedure 1/5.

## 2022-08-27 ENCOUNTER — Telehealth: Payer: Self-pay | Admitting: Cardiology

## 2022-08-27 NOTE — Telephone Encounter (Signed)
Patient calling to reschedule his ablation 1/5.

## 2022-08-27 NOTE — Telephone Encounter (Signed)
I spoke with pt and he would like to reschedule due to cost.  Per Dr Curt Bears he should not need another CT Scan.  I have scheduled him a f/u appt for H&P with AT on 12/10/22 and we will get labs that day.   Precert and Doylene Canning have been made aware.

## 2022-09-30 ENCOUNTER — Ambulatory Visit (HOSPITAL_COMMUNITY): Payer: 59 | Admitting: Nurse Practitioner

## 2022-10-02 ENCOUNTER — Encounter (HOSPITAL_COMMUNITY): Payer: Self-pay | Admitting: *Deleted

## 2022-10-07 ENCOUNTER — Ambulatory Visit (HOSPITAL_COMMUNITY): Payer: 59 | Admitting: Nurse Practitioner

## 2022-12-01 ENCOUNTER — Ambulatory Visit: Payer: 59 | Admitting: Cardiology

## 2022-12-10 ENCOUNTER — Ambulatory Visit: Payer: 59 | Admitting: Student

## 2022-12-11 NOTE — Progress Notes (Signed)
  Electrophysiology Office Note:   Date:  12/12/2022  ID:  Mark Ponce, DOB 07-May-1971, MRN [id number]  Primary Cardiologist: None Electrophysiologist: Will [name], MD   History of Present Illness:   Mark Ponce is a 52 y.o. male with h/o PAF seen today for routine electrophysiology followup.   Since last being seen in our clinic the patient reports doing well overall. He has been off of his Eliquis for about a month. States they didn't refill it and he didn't inquire further. His chronic complaint has been mild fatigue in AF.   he denies chest pain, palpitations, dyspnea, PND, orthopnea, nausea, vomiting, dizziness, syncope, edema, weight gain, or early satiety.   Review of systems complete and found to be negative unless listed in HPI.   Studies Reviewed:    EKG is ordered today. Personal review shows AF at 76 bpm  with occasional PVCs  Physical Exam:   VS:  BP (!) 146/92   Pulse 76   Ht  (1.803 m)   Wt 207 lb (93.9 kg)   SpO2 96%   BMI 28.87 kg/m    Wt Readings from Last 3 Encounters:  12/12/22 207 lb (93.9 kg)  07/31/22 209 lb (94.8 kg)  01/16/22 209 lb (94.8 kg)     GEN: Well nourished, well developed in no acute distress NECK: No JVD; No carotid bruits CARDIAC: Irregularly irregular rate and rhythm, no murmurs, rubs, gallops RESPIRATORY:  Clear to auscultation without rales, wheezing or rhonchi  ABDOMEN: Soft, non-tender, non-distended EXTREMITIES:  No edema; No deformity   ASSESSMENT AND PLAN:    Persistent atrial fibrillation Planned for ablation with Dr. Elberta Fortis 5/9.  Originally planned for 08/2022 but there were issues with prior auth and deferred due to cost Resume eliquis. Reviewed with Dr. Elberta Fortis. Will plan TEE earlier in the week of 5/7 to r/o thrombus, but will plan to continue with ablation if TEE unremarkable.  Pt understands the importance of no further missed doses of eliquis prior to the procedure and the added importance of  no missed doses post procedurally. Continue coreg Reviewed risks, benefits, and alternatives to AF ablation and pt remains agreeable to proceed CHA2DS2/VASc is at least 1   Follow up with Dr. Elberta Fortis as usual post procedure  Signed, Graciella Freer, PA-C

## 2022-12-11 NOTE — H&P (View-Only) (Signed)
  Electrophysiology Office Note:   Date:  12/12/2022  ID:  Mark Ponce, DOB 04/25/1971, MRN [id number]  Primary Cardiologist: None Electrophysiologist: Will Martin Camnitz, MD   History of Present Illness:   Mark Ponce is a 52 y.o. male with h/o PAF seen today for routine electrophysiology followup.   Since last being seen in our clinic the patient reports doing well overall. He has been off of his Eliquis for about a month. States they didn't refill it and he didn't inquire further. His chronic complaint has been mild fatigue in AF.   he denies chest pain, palpitations, dyspnea, PND, orthopnea, nausea, vomiting, dizziness, syncope, edema, weight gain, or early satiety.   Review of systems complete and found to be negative unless listed in HPI.   Studies Reviewed:    EKG is ordered today. Personal review shows AF at 76 bpm  with occasional PVCs  Physical Exam:   VS:  BP (!) 146/92   Pulse 76   Ht 5' 11" (1.803 m)   Wt 207 lb (93.9 kg)   SpO2 96%   BMI 28.87 kg/m    Wt Readings from Last 3 Encounters:  12/12/22 207 lb (93.9 kg)  07/31/22 209 lb (94.8 kg)  01/16/22 209 lb (94.8 kg)     GEN: Well nourished, well developed in no acute distress NECK: No JVD; No carotid bruits CARDIAC: Irregularly irregular rate and rhythm, no murmurs, rubs, gallops RESPIRATORY:  Clear to auscultation without rales, wheezing or rhonchi  ABDOMEN: Soft, non-tender, non-distended EXTREMITIES:  No edema; No deformity   ASSESSMENT AND PLAN:    Persistent atrial fibrillation Planned for ablation with Dr. Camnitz 5/9.  Originally planned for 08/2022 but there were issues with prior auth and deferred due to cost Resume eliquis. Reviewed with Dr. Camnitz. Will plan TEE earlier in the week of 5/7 to r/o thrombus, but will plan to continue with ablation if TEE unremarkable.  Pt understands the importance of no further missed doses of eliquis prior to the procedure and the added importance of  no missed doses post procedurally. Continue coreg Reviewed risks, benefits, and alternatives to AF ablation and pt remains agreeable to proceed CHA2DS2/VASc is at least 1   Follow up with Dr. Camnitz as usual post procedure  Signed, Kamesha Herne Andrew Lorey Pallett, PA-C  

## 2022-12-11 NOTE — H&P (View-Only) (Signed)
  Electrophysiology Office Note:   Date:  12/12/2022  ID:  Mark Ponce, DOB 05/16/1971, MRN [id number]  Primary Cardiologist: None Electrophysiologist: Will Martin Camnitz, MD   History of Present Illness:   Mark Ponce is a 52 y.o. male with h/o PAF seen today for routine electrophysiology followup.   Since last being seen in our clinic the patient reports doing well overall. He has been off of his Eliquis for about a month. States they didn't refill it and he didn't inquire further. His chronic complaint has been mild fatigue in AF.   he denies chest pain, palpitations, dyspnea, PND, orthopnea, nausea, vomiting, dizziness, syncope, edema, weight gain, or early satiety.   Review of systems complete and found to be negative unless listed in HPI.   Studies Reviewed:    EKG is ordered today. Personal review shows AF at 76 bpm  with occasional PVCs  Physical Exam:   VS:  BP (!) 146/92   Pulse 76   Ht 5' 11" (1.803 m)   Wt 207 lb (93.9 kg)   SpO2 96%   BMI 28.87 kg/m    Wt Readings from Last 3 Encounters:  12/12/22 207 lb (93.9 kg)  07/31/22 209 lb (94.8 kg)  01/16/22 209 lb (94.8 kg)     GEN: Well nourished, well developed in no acute distress NECK: No JVD; No carotid bruits CARDIAC: Irregularly irregular rate and rhythm, no murmurs, rubs, gallops RESPIRATORY:  Clear to auscultation without rales, wheezing or rhonchi  ABDOMEN: Soft, non-tender, non-distended EXTREMITIES:  No edema; No deformity   ASSESSMENT AND PLAN:    Persistent atrial fibrillation Planned for ablation with Dr. Camnitz 5/9.  Originally planned for 08/2022 but there were issues with prior auth and deferred due to cost Resume eliquis. Reviewed with Dr. Camnitz. Will plan TEE earlier in the week of 5/7 to r/o thrombus, but will plan to continue with ablation if TEE unremarkable.  Pt understands the importance of no further missed doses of eliquis prior to the procedure and the added importance of  no missed doses post procedurally. Continue coreg Reviewed risks, benefits, and alternatives to AF ablation and pt remains agreeable to proceed CHA2DS2/VASc is at least 1   Follow up with Dr. Camnitz as usual post procedure  Signed, Chelsy Parrales Andrew Kameshia Madruga, PA-C  

## 2022-12-12 ENCOUNTER — Encounter: Payer: Self-pay | Admitting: *Deleted

## 2022-12-12 ENCOUNTER — Ambulatory Visit: Payer: 59 | Attending: Student | Admitting: Student

## 2022-12-12 ENCOUNTER — Encounter: Payer: Self-pay | Admitting: Student

## 2022-12-12 VITALS — BP 146/92 | HR 76 | Ht 71.0 in | Wt 207.0 lb

## 2022-12-12 DIAGNOSIS — I4819 Other persistent atrial fibrillation: Secondary | ICD-10-CM

## 2022-12-12 DIAGNOSIS — Z01812 Encounter for preprocedural laboratory examination: Secondary | ICD-10-CM

## 2022-12-12 MED ORDER — APIXABAN 5 MG PO TABS: 5.0000 mg | ORAL_TABLET | Freq: Two times a day (BID) | ORAL | 3 refills | Status: DC

## 2022-12-12 NOTE — Patient Instructions (Signed)
Medication Instructions:  Your physician recommends that you continue on your current medications as directed. Please refer to the Current Medication list given to you today.  Be sure not to miss any doses of Eliquis *If you need a refill on your cardiac medications before your next appointment, please call your pharmacy*   Lab Work: BMET, CBC--TODAY If you have labs (blood work) drawn today and your tests are completely normal, you will receive your results only by: MyChart Message (if you have MyChart) OR A paper copy in the mail If you have any lab test that is abnormal or we need to change your treatment, we will call you to review the results.   Testing/Procedures: See letters   Follow-Up: At [facility], you and your health needs are our priority.  As part of our continuing mission to provide you with exceptional heart care, we have created designated Provider Care Teams.  These Care Teams include your primary Cardiologist (physician) and Advanced Practice Providers (APPs -  Physician Assistants and Nurse Practitioners) who all work together to provide you with the care you need, when you need it.  Your next appointment:   After procedure

## 2022-12-13 LAB — CBC
Hematocrit: 45.5 % (ref 37.5–51.0)
Hemoglobin: 15.9 g/dL (ref 13.0–17.7)
MCH: 31.9 pg (ref 26.6–33.0)
MCHC: 34.9 g/dL (ref 31.5–35.7)
MCV: 91 fL (ref 79–97)
Platelets: 157 10*3/uL (ref 150–450)
RBC: 4.99 x10E6/uL (ref 4.14–5.80)
RDW: 13.1 % (ref 11.6–15.4)
WBC: 7.7 10*3/uL (ref 3.4–10.8)

## 2022-12-13 LAB — BASIC METABOLIC PANEL
BUN/Creatinine Ratio: 11 (ref 9–20)
BUN: 15 mg/dL (ref 6–24)
CO2: 23 mmol/L (ref 20–29)
Calcium: 9.8 mg/dL (ref 8.7–10.2)
Chloride: 106 mmol/L (ref 96–106)
Creatinine, Ser: 1.31 mg/dL — ABNORMAL HIGH (ref 0.76–1.27)
Glucose: 87 mg/dL (ref 70–99)
Potassium: 5.1 mmol/L (ref 3.5–5.2)
Sodium: 141 mmol/L (ref 134–144)
eGFR: 65 mL/min/{1.73_m2} (ref >59)

## 2022-12-23 ENCOUNTER — Telehealth: Payer: Self-pay | Admitting: *Deleted

## 2022-12-23 NOTE — Telephone Encounter (Signed)
Aware of time to arrive for procedure next week.  Sent TEE instructions via my chart per pt request.  Pt appreciates the call.

## 2022-12-23 NOTE — Telephone Encounter (Signed)
Patient is returning call. Was informed of of new time. Patient is requesting call back to go over other info.

## 2022-12-23 NOTE — Telephone Encounter (Addendum)
Left message to call back   (Pt is now first case, time to arrive is 5:30 am)

## 2022-12-26 NOTE — Pre-Procedure Instructions (Signed)
Spoke with patient on the phone about TEE on Monday - arrive at 0900, NPO after 0000, ensure ride home and responsible person to stay with pt 24 hours after (if pt has trouble getting ride, advised to call back or call doctor to reschedule)

## 2022-12-29 ENCOUNTER — Encounter (HOSPITAL_COMMUNITY)
Admission: RE | Disposition: A | Discharge status: Home / Self Care | Payer: Self-pay | Source: Home / Self Care | Attending: Internal Medicine

## 2022-12-29 ENCOUNTER — Ambulatory Visit (HOSPITAL_BASED_OUTPATIENT_CLINIC_OR_DEPARTMENT_OTHER)
Admission: RE | Admit: 2022-12-29 | Discharge: 2022-12-29 | Disposition: A | Discharge status: Home / Self Care | Payer: 59 | Source: Ambulatory Visit | Attending: Internal Medicine | Admitting: Internal Medicine

## 2022-12-29 ENCOUNTER — Ambulatory Visit (HOSPITAL_BASED_OUTPATIENT_CLINIC_OR_DEPARTMENT_OTHER): Payer: 59 | Admitting: Certified Registered"

## 2022-12-29 ENCOUNTER — Ambulatory Visit (HOSPITAL_COMMUNITY): Payer: 59 | Admitting: Certified Registered"

## 2022-12-29 ENCOUNTER — Other Ambulatory Visit: Payer: Self-pay

## 2022-12-29 ENCOUNTER — Ambulatory Visit (HOSPITAL_COMMUNITY)
Admission: RE | Admit: 2022-12-29 | Discharge: 2022-12-29 | Disposition: A | Discharge status: Home / Self Care | Payer: 59 | Attending: Internal Medicine | Admitting: Internal Medicine

## 2022-12-29 ENCOUNTER — Encounter (HOSPITAL_COMMUNITY): Payer: Self-pay | Admitting: Internal Medicine

## 2022-12-29 DIAGNOSIS — I340 Nonrheumatic mitral (valve) insufficiency: Secondary | ICD-10-CM | POA: Diagnosis not present

## 2022-12-29 DIAGNOSIS — I4819 Other persistent atrial fibrillation: Secondary | ICD-10-CM | POA: Insufficient documentation

## 2022-12-29 DIAGNOSIS — I083 Combined rheumatic disorders of mitral, aortic and tricuspid valves: Secondary | ICD-10-CM | POA: Insufficient documentation

## 2022-12-29 DIAGNOSIS — I10 Essential (primary) hypertension: Secondary | ICD-10-CM | POA: Diagnosis not present

## 2022-12-29 DIAGNOSIS — I4891 Unspecified atrial fibrillation: Secondary | ICD-10-CM | POA: Diagnosis not present

## 2022-12-29 DIAGNOSIS — J9691 Respiratory failure, unspecified with hypoxia: Secondary | ICD-10-CM | POA: Insufficient documentation

## 2022-12-29 DIAGNOSIS — Z7901 Long term (current) use of anticoagulants: Secondary | ICD-10-CM | POA: Insufficient documentation

## 2022-12-29 HISTORY — PX: TEE WITHOUT CARDIOVERSION: SHX5443

## 2022-12-29 LAB — ECHO TEE

## 2022-12-29 SURGERY — ECHOCARDIOGRAM, TRANSESOPHAGEAL
Anesthesia: Monitor Anesthesia Care

## 2022-12-29 MED ORDER — SODIUM CHLORIDE 0.9 % IV SOLN: INTRAVENOUS | Status: DC | PRN

## 2022-12-29 MED ORDER — PROPOFOL 500 MG/50ML IV EMUL
INTRAVENOUS | Status: DC | PRN
  Administered 2022-12-29: 75 ug/kg/min via INTRAVENOUS

## 2022-12-29 MED ORDER — PROPOFOL 10 MG/ML IV BOLUS
INTRAVENOUS | Status: DC | PRN
  Administered 2022-12-29: 100 mg via INTRAVENOUS

## 2022-12-29 MED ORDER — LIDOCAINE 2% (20 MG/ML) 5 ML SYRINGE
INTRAMUSCULAR | Status: DC | PRN
  Administered 2022-12-29: 80 mg via INTRAVENOUS

## 2022-12-29 NOTE — Transfer of Care (Signed)
Immediate Anesthesia Transfer of Care Note  Patient: Mark Ponce  Procedure(s) Performed: TRANSESOPHAGEAL ECHOCARDIOGRAM  Patient Location: PACU and Cath Lab  Anesthesia Type:MAC  Level of Consciousness: awake and alert   Airway & Oxygen Therapy: Patient Spontanous Breathing  Post-op Assessment: Report given to RN and Post -op Vital signs reviewed and stable  Post vital signs: Reviewed and stable  Last Vitals:  Vitals Value Taken Time  BP    Temp    Pulse    Resp    SpO2      Last Pain:  Vitals:   12/29/22 0914  PainSc: 0-No pain         Complications: No notable events documented.

## 2022-12-29 NOTE — Progress Notes (Signed)
  Echocardiogram Echocardiogram Transesophageal has been performed.  Milda Smart 12/29/2022, 10:22 AM

## 2022-12-29 NOTE — Addendum Note (Signed)
Addendum  created 12/29/22 1036 by Alwyn Ren, CRNA   Intraprocedure Event edited

## 2022-12-29 NOTE — OR Nursing (Signed)
Bedside swallow performed by RN. Patient able to swallow before discharge.

## 2022-12-29 NOTE — CV Procedure (Signed)
    TRANSESOPHAGEAL ECHOCARDIOGRAM   NAME:  OMNI HELLMICH    MRN: [id number] DOB:  1970-11-12    ADMIT DATE: 12/29/2022  INDICATIONS: Atrial fibrillation  PROCEDURE:   Informed consent was obtained prior to the procedure. The risks, benefits and alternatives for the procedure were discussed and the patient comprehended these risks.  Risks include, but are not limited to, cough, sore throat, vomiting, nausea, somnolence, esophageal and stomach trauma or perforation, bleeding, low blood pressure, aspiration, pneumonia, infection, trauma to the teeth and death.    Procedural time out performed. The oropharynx was anesthetized with topical 1% benzocaine.    Anesthesia was administered by Dr. Okey Dupre and team.  The patient's heart rate, blood pressure, and oxygen saturation are monitored continuously during the procedure.   The transesophageal probe was inserted in the esophagus and stomach without difficulty and multiple views were obtained.   COMPLICATIONS:    There were no immediate complications.  KEY FINDINGS:  Left atrial appendage sludge.  Planned to do perform contrast imaging; patient had desaturation, unable to complete this due to hypoxia.   Full report to follow. Further management per primary team.   Riley Lam, MD [place]  [facility]  11:21 AM

## 2022-12-29 NOTE — Interval H&P Note (Signed)
History and Physical Interval Note:  12/29/2022 9:42 AM  Mark Ponce  has presented today for surgery, with the diagnosis of rule out thrombus, afib.  The various methods of treatment have been discussed with the patient and family. After consideration of risks, benefits and other options for treatment, the patient has consented to  Procedure(s): TRANSESOPHAGEAL ECHOCARDIOGRAM (N/A) as a surgical intervention.  The patient's history has been reviewed, patient examined, no change in status, stable for surgery.  I have reviewed the patient's chart and labs.  Questions were answered to the patient's satisfaction.     Mylinda Brook A Marlaine Arey

## 2022-12-29 NOTE — Progress Notes (Deleted)
  Echocardiogram 2D Echocardiogram has been performed.  Mark Ponce 12/29/2022, 10:21 AM

## 2022-12-29 NOTE — Anesthesia Preprocedure Evaluation (Signed)
Anesthesia Evaluation  Patient identified by MRN, date of birth, ID band Patient awake    Reviewed: Allergy & Precautions, H&P , NPO status , Patient's Chart, lab work & pertinent test results  Airway Mallampati: II  TM Distance: >3 FB Neck ROM: Full    Dental no notable dental hx.    Pulmonary neg pulmonary ROS   Pulmonary exam normal breath sounds clear to auscultation       Cardiovascular hypertension, Normal cardiovascular exam+ dysrhythmias Atrial Fibrillation  Rhythm:Regular Rate:Normal     Neuro/Psych negative neurological ROS  negative psych ROS   GI/Hepatic negative GI ROS, Neg liver ROS,,,  Endo/Other  negative endocrine ROS    Renal/GU negative Renal ROS  negative genitourinary   Musculoskeletal negative musculoskeletal ROS (+)    Abdominal   Peds negative pediatric ROS (+)  Hematology negative hematology ROS (+)   Anesthesia Other Findings   Reproductive/Obstetrics negative OB ROS                             Anesthesia Physical Anesthesia Plan  ASA: 3  Anesthesia Plan: MAC   Post-op Pain Management: Minimal or no pain anticipated   Induction: Intravenous  PONV Risk Score and Plan: 1 and Propofol infusion and Treatment may vary due to age or medical condition  Airway Management Planned: Nasal Cannula  Additional Equipment:   Intra-op Plan:   Post-operative Plan:   Informed Consent: I have reviewed the patients History and Physical, chart, labs and discussed the procedure including the risks, benefits and alternatives for the proposed anesthesia with the patient or authorized representative who has indicated his/her understanding and acceptance.     Dental advisory given  Plan Discussed with: CRNA and Surgeon  Anesthesia Plan Comments:        Anesthesia Quick Evaluation

## 2022-12-29 NOTE — Anesthesia Postprocedure Evaluation (Signed)
Anesthesia Post Note  Patient: Mark Ponce  Procedure(s) Performed: TRANSESOPHAGEAL ECHOCARDIOGRAM     Patient location during evaluation: PACU Anesthesia Type: MAC Level of consciousness: awake and alert Pain management: pain level controlled Vital Signs Assessment: post-procedure vital signs reviewed and stable Respiratory status: spontaneous breathing, nonlabored ventilation, respiratory function stable and patient connected to nasal cannula oxygen Cardiovascular status: stable and blood pressure returned to baseline Postop Assessment: no apparent nausea or vomiting Anesthetic complications: no  No notable events documented.  Last Vitals:  Vitals:   12/29/22 1028 12/29/22 1030  Pulse: (!) 108 86  Resp: (!) 21 15  Temp: 36.8 C   SpO2: 94% 96%    Last Pain:  Vitals:   12/29/22 1028  TempSrc: Temporal  PainSc: 0-No pain                 Narely Nobles S

## 2022-12-30 ENCOUNTER — Encounter: Payer: Self-pay | Admitting: Cardiology

## 2022-12-31 NOTE — Pre-Procedure Instructions (Signed)
Attempted to call patient regarding ablation instructions.  Left voicemail on the following items: Arrival time 0515 Nothing to eat or drink after midnight No meds AM of procedure Responsible person to drive you home and stay with you for 24 hrs  Have you missed any doses of anti-coagulant Eliquis- should be twice a day, if you have missed any doses please let office know right away.  Don't take Eliquis in the morning.

## 2023-01-01 ENCOUNTER — Ambulatory Visit (HOSPITAL_COMMUNITY): Payer: 59 | Admitting: Anesthesiology

## 2023-01-01 ENCOUNTER — Encounter (HOSPITAL_COMMUNITY): Payer: Self-pay | Admitting: Cardiology

## 2023-01-01 ENCOUNTER — Ambulatory Visit (HOSPITAL_BASED_OUTPATIENT_CLINIC_OR_DEPARTMENT_OTHER): Payer: 59

## 2023-01-01 ENCOUNTER — Ambulatory Visit (HOSPITAL_BASED_OUTPATIENT_CLINIC_OR_DEPARTMENT_OTHER): Payer: 59 | Admitting: Anesthesiology

## 2023-01-01 ENCOUNTER — Encounter (HOSPITAL_COMMUNITY)
Admission: RE | Disposition: A | Discharge status: Home / Self Care | Payer: 59 | Source: Ambulatory Visit | Attending: Cardiology

## 2023-01-01 ENCOUNTER — Ambulatory Visit (HOSPITAL_COMMUNITY)
Admission: RE | Admit: 2023-01-01 | Discharge: 2023-01-01 | Disposition: A | Discharge status: Home / Self Care | Payer: 59 | Source: Ambulatory Visit | Attending: Cardiology | Admitting: Cardiology

## 2023-01-01 ENCOUNTER — Other Ambulatory Visit: Payer: Self-pay

## 2023-01-01 DIAGNOSIS — Z7901 Long term (current) use of anticoagulants: Secondary | ICD-10-CM | POA: Diagnosis not present

## 2023-01-01 DIAGNOSIS — Z79899 Other long term (current) drug therapy: Secondary | ICD-10-CM | POA: Diagnosis not present

## 2023-01-01 DIAGNOSIS — I10 Essential (primary) hypertension: Secondary | ICD-10-CM

## 2023-01-01 DIAGNOSIS — I4891 Unspecified atrial fibrillation: Secondary | ICD-10-CM | POA: Diagnosis not present

## 2023-01-01 DIAGNOSIS — I4819 Other persistent atrial fibrillation: Secondary | ICD-10-CM | POA: Diagnosis not present

## 2023-01-01 DIAGNOSIS — I340 Nonrheumatic mitral (valve) insufficiency: Secondary | ICD-10-CM | POA: Diagnosis not present

## 2023-01-01 HISTORY — PX: ATRIAL FIBRILLATION ABLATION: EP1191

## 2023-01-01 LAB — ECHO TEE
Height: 71 in
Weight: 3312 oz

## 2023-01-01 LAB — POCT ACTIVATED CLOTTING TIME
Activated Clotting Time: 304 seconds
Activated Clotting Time: 320 seconds

## 2023-01-01 SURGERY — ATRIAL FIBRILLATION ABLATION
Anesthesia: General

## 2023-01-01 MED ORDER — PROTAMINE SULFATE 10 MG/ML IV SOLN
INTRAVENOUS | Status: DC | PRN
  Administered 2023-01-01: 40 mg via INTRAVENOUS

## 2023-01-01 MED ORDER — ONDANSETRON HCL 4 MG/2ML IJ SOLN: 4.0000 mg | Freq: Four times a day (QID) | INTRAMUSCULAR | Status: DC | PRN

## 2023-01-01 MED ORDER — SODIUM CHLORIDE 0.9% FLUSH: 3.0000 mL | INTRAVENOUS | Status: DC | PRN

## 2023-01-01 MED ORDER — SODIUM CHLORIDE 0.9 % IV SOLN
INTRAVENOUS | Status: DC
Start: 2023-01-01 — End: 2023-01-01

## 2023-01-01 MED ORDER — DEXAMETHASONE SODIUM PHOSPHATE 10 MG/ML IJ SOLN
INTRAMUSCULAR | Status: DC | PRN
  Administered 2023-01-01: 10 mg via INTRAVENOUS

## 2023-01-01 MED ORDER — HEPARIN (PORCINE) IN NACL 1000-0.9 UT/500ML-% IV SOLN
INTRAVENOUS | Status: DC | PRN
  Administered 2023-01-01 (×4): 500 mL

## 2023-01-01 MED ORDER — SODIUM CHLORIDE 0.9 % IV SOLN: 250.0000 mL | INTRAVENOUS | Status: DC | PRN

## 2023-01-01 MED ORDER — HEPARIN SODIUM (PORCINE) 1000 UNIT/ML IJ SOLN
INTRAMUSCULAR | Status: DC | PRN
  Administered 2023-01-01: 1000 [IU] via INTRAVENOUS

## 2023-01-01 MED ORDER — ACETAMINOPHEN 325 MG PO TABS: 650.0000 mg | ORAL_TABLET | ORAL | Status: DC | PRN

## 2023-01-01 MED ORDER — LIDOCAINE 2% (20 MG/ML) 5 ML SYRINGE
INTRAMUSCULAR | Status: DC | PRN
  Administered 2023-01-01: 60 mg via INTRAVENOUS

## 2023-01-01 MED ORDER — PHENYLEPHRINE HCL-NACL 20-0.9 MG/250ML-% IV SOLN
INTRAVENOUS | Status: DC | PRN
  Administered 2023-01-01: 25 ug/min via INTRAVENOUS

## 2023-01-01 MED ORDER — FENTANYL CITRATE (PF) 250 MCG/5ML IJ SOLN
INTRAMUSCULAR | Status: DC | PRN
  Administered 2023-01-01: 100 ug via INTRAVENOUS

## 2023-01-01 MED ORDER — HEPARIN SODIUM (PORCINE) 1000 UNIT/ML IJ SOLN
INTRAMUSCULAR | Status: DC | PRN
  Administered 2023-01-01: 14000 [IU] via INTRAVENOUS
  Administered 2023-01-01: 3000 [IU] via INTRAVENOUS

## 2023-01-01 MED ORDER — SUGAMMADEX SODIUM 200 MG/2ML IV SOLN
INTRAVENOUS | Status: DC | PRN
  Administered 2023-01-01: 200 mg via INTRAVENOUS

## 2023-01-01 MED ORDER — PERFLUTREN LIPID MICROSPHERE
1.0000 mL | INTRAVENOUS | Status: AC | PRN
  Administered 2023-01-01: 3 mL via INTRAVENOUS

## 2023-01-01 MED ORDER — SODIUM CHLORIDE 0.9% FLUSH: 3.0000 mL | Freq: Two times a day (BID) | INTRAVENOUS | Status: DC

## 2023-01-01 MED ORDER — PROPOFOL 10 MG/ML IV BOLUS
INTRAVENOUS | Status: DC | PRN
  Administered 2023-01-01: 200 mg via INTRAVENOUS

## 2023-01-01 MED ORDER — ONDANSETRON HCL 4 MG/2ML IJ SOLN
INTRAMUSCULAR | Status: DC | PRN
  Administered 2023-01-01: 4 mg via INTRAVENOUS

## 2023-01-01 MED ORDER — MIDAZOLAM HCL 5 MG/5ML IJ SOLN
INTRAMUSCULAR | Status: DC | PRN
  Administered 2023-01-01: 2 mg via INTRAVENOUS

## 2023-01-01 MED ORDER — ROCURONIUM BROMIDE 10 MG/ML (PF) SYRINGE
PREFILLED_SYRINGE | INTRAVENOUS | Status: DC | PRN
  Administered 2023-01-01: 20 mg via INTRAVENOUS
  Administered 2023-01-01 (×2): 30 mg via INTRAVENOUS

## 2023-01-01 SURGICAL SUPPLY — 21 items
BLANKET WARM UNDERBOD FULL ACC (MISCELLANEOUS) ×1 IMPLANT
CATH ABLAT QDOT MICRO BI TC DF (CATHETERS) IMPLANT
CATH OCTARAY 2.0 F 3-3-3-3-3 (CATHETERS) IMPLANT
CATH PIGTAIL STEERABLE D1 8.7 (WIRE) IMPLANT
CATH S-M CIRCA TEMP PROBE (CATHETERS) IMPLANT
CATH SOUNDSTAR ECO 8FR (CATHETERS) IMPLANT
CATH WEB BI DIR CSDF CRV REPRO (CATHETERS) IMPLANT
CLOSURE MYNX CONTROL 6F/7F (Vascular Products) IMPLANT
CLOSURE PERCLOSE PROSTYLE (VASCULAR PRODUCTS) IMPLANT
COVER SWIFTLINK CONNECTOR (BAG) ×1 IMPLANT
MAT PREVALON FULL STRYKER (MISCELLANEOUS) IMPLANT
PACK EP LATEX FREE (CUSTOM PROCEDURE TRAY) ×1
PACK EP LF (CUSTOM PROCEDURE TRAY) ×1 IMPLANT
PAD DEFIB RADIO PHYSIO CONN (PAD) ×1 IMPLANT
PATCH CARTO3 (PAD) IMPLANT
SHEATH CARTO VIZIGO SM CVD (SHEATH) IMPLANT
SHEATH PINNACLE 7F 10CM (SHEATH) IMPLANT
SHEATH PINNACLE 8F 10CM (SHEATH) IMPLANT
SHEATH PINNACLE 9F 10CM (SHEATH) IMPLANT
SHEATH PROBE COVER 6X72 (BAG) IMPLANT
TUBING SMART ABLATE COOLFLOW (TUBING) IMPLANT

## 2023-01-01 NOTE — Progress Notes (Signed)
Pt ambulated to and from bathroom to void with no signs of oozing from bilateral groin sites  

## 2023-01-01 NOTE — Progress Notes (Signed)
PA andy notified of BP, will continue to monitor

## 2023-01-01 NOTE — Discharge Instructions (Signed)

## 2023-01-01 NOTE — Interval H&P Note (Signed)
History and Physical Interval Note:  01/01/2023 7:06 AM  Mark Ponce  has presented today for surgery, with the diagnosis of afib.  The various methods of treatment have been discussed with the patient and family. After consideration of risks, benefits and other options for treatment, the patient has consented to  Procedure(s): ATRIAL FIBRILLATION ABLATION (N/A) TRANSESOPHAGEAL ECHOCARDIOGRAM (N/A) as a surgical intervention.  The patient's history has been reviewed, patient examined, no change in status, stable for surgery.  I have reviewed the patient's chart and labs.  Questions were answered to the patient's satisfaction.     Alireza Pollack [employer]

## 2023-01-01 NOTE — Transfer of Care (Signed)
Immediate Anesthesia Transfer of Care Note  Patient: Mark Ponce  Procedure(s) Performed: ATRIAL FIBRILLATION ABLATION TRANSESOPHAGEAL ECHOCARDIOGRAM  Patient Location: Cath Lab  Anesthesia Type:General  Level of Consciousness: awake, alert , and oriented  Airway & Oxygen Therapy: Patient Spontanous Breathing and Patient connected to nasal cannula oxygen  Post-op Assessment: Report given to RN, Post -op Vital signs reviewed and stable, and Patient moving all extremities  Post vital signs: Reviewed and stable  Last Vitals:  Vitals Value Taken Time  BP 127/90 01/01/23 1030  Temp    Pulse 69 01/01/23 1030  Resp 21 01/01/23 1030  SpO2 100 % 01/01/23 1030  Vitals shown include unvalidated device data.  Last Pain:  Vitals:   01/01/23 0549  TempSrc:   PainSc: 0-No pain      Patients Stated Pain Goal: 3 (01/01/23 0549)  Complications: There were no known notable events for this encounter.

## 2023-01-01 NOTE — Anesthesia Postprocedure Evaluation (Signed)
Anesthesia Post Note  Patient: Mark Ponce  Procedure(s) Performed: ATRIAL FIBRILLATION ABLATION TRANSESOPHAGEAL ECHOCARDIOGRAM     Patient location during evaluation: PACU Anesthesia Type: General Level of consciousness: awake and alert Pain management: pain level controlled Vital Signs Assessment: post-procedure vital signs reviewed and stable Respiratory status: spontaneous breathing, nonlabored ventilation, respiratory function stable and patient connected to nasal cannula oxygen Cardiovascular status: blood pressure returned to baseline and stable Postop Assessment: no apparent nausea or vomiting Anesthetic complications: no   There were no known notable events for this encounter.  Last Vitals:  Vitals:   01/01/23 1230 01/01/23 1304  BP: (!) 147/109 (!) 153/108  Pulse: 78 85  Resp: 13 15  Temp:    SpO2: 100% 98%    Last Pain:  Vitals:   01/01/23 1111  TempSrc:   PainSc: 0-No pain                 Arianne Klinge S

## 2023-01-01 NOTE — Anesthesia Procedure Notes (Addendum)
Procedure Name: Intubation Date/Time: 01/01/2023 7:53 AM  Performed by: Quentin Ore, CRNAPre-anesthesia Checklist: Patient identified, Emergency Drugs available, Suction available and Patient being monitored Patient Re-evaluated:Patient Re-evaluated prior to induction Oxygen Delivery Method: Circle system utilized Preoxygenation: Pre-oxygenation with 100% oxygen Induction Type: IV induction Ventilation: Mask ventilation without difficulty Laryngoscope Size: Mac and 4 Grade View: Grade III Tube type: Oral Tube size: 7.5 mm Number of attempts: 1 Airway Equipment and Method: Stylet Placement Confirmation: ETT inserted through vocal cords under direct vision, positive ETCO2 and breath sounds checked- equal and bilateral Secured at: 22 cm Tube secured with: Tape Dental Injury: Teeth and Oropharynx as per pre-operative assessment

## 2023-01-01 NOTE — Anesthesia Preprocedure Evaluation (Signed)
Anesthesia Evaluation  Patient identified by MRN, date of birth, ID band Patient awake    Reviewed: Allergy & Precautions, H&P , NPO status , Patient's Chart, lab work & pertinent test results  Airway Mallampati: II   Neck ROM: full    Dental   Pulmonary neg pulmonary ROS   breath sounds clear to auscultation       Cardiovascular hypertension, + dysrhythmias Atrial Fibrillation  Rhythm:irregular Rate:Normal     Neuro/Psych    GI/Hepatic   Endo/Other    Renal/GU      Musculoskeletal   Abdominal   Peds  Hematology   Anesthesia Other Findings   Reproductive/Obstetrics                             Anesthesia Physical Anesthesia Plan  ASA: 3  Anesthesia Plan: General   Post-op Pain Management:    Induction: Intravenous  PONV Risk Score and Plan: 2 and Ondansetron, Dexamethasone, Midazolam and Treatment may vary due to age or medical condition  Airway Management Planned: Oral ETT  Additional Equipment:   Intra-op Plan:   Post-operative Plan: Extubation in OR  Informed Consent: I have reviewed the patients History and Physical, chart, labs and discussed the procedure including the risks, benefits and alternatives for the proposed anesthesia with the patient or authorized representative who has indicated his/her understanding and acceptance.     Dental advisory given  Plan Discussed with: CRNA, Anesthesiologist and Surgeon  Anesthesia Plan Comments:        Anesthesia Quick Evaluation

## 2023-01-02 ENCOUNTER — Telehealth: Payer: Self-pay | Admitting: Cardiology

## 2023-01-02 ENCOUNTER — Encounter (HOSPITAL_COMMUNITY): Payer: Self-pay | Admitting: Cardiology

## 2023-01-02 ENCOUNTER — Encounter: Payer: Self-pay | Admitting: *Deleted

## 2023-01-02 NOTE — Telephone Encounter (Signed)
Pt aware I will get a letter together today, and send via mychart. Informed that if he needs a signature to let me know and I can fax if needed. Patient verbalized understanding and agreeable to plan.

## 2023-01-02 NOTE — Telephone Encounter (Signed)
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 Please advise

## 2023-01-06 ENCOUNTER — Telehealth: Payer: Self-pay | Admitting: Cardiology

## 2023-01-06 ENCOUNTER — Encounter: Payer: Self-pay | Admitting: *Deleted

## 2023-01-06 NOTE — Telephone Encounter (Signed)
Calling to get a clearance letter to return back to work. Please advise

## 2023-01-06 NOTE — Telephone Encounter (Signed)
Pt aware I will get letter together and sent via mychart in a few minutes. Advised if he needs MD signature to send me fax number and I will fax w/ signature if needed. Pt appreciates the help with this.

## 2023-01-29 ENCOUNTER — Ambulatory Visit (HOSPITAL_COMMUNITY): Payer: Self-pay | Admitting: Internal Medicine

## 2023-02-02 ENCOUNTER — Ambulatory Visit (HOSPITAL_COMMUNITY)
Admission: RE | Admit: 2023-02-02 | Discharge: 2023-02-02 | Disposition: A | Discharge status: Home / Self Care | Payer: 59 | Source: Ambulatory Visit | Attending: Nurse Practitioner | Admitting: Nurse Practitioner

## 2023-02-02 ENCOUNTER — Encounter (HOSPITAL_COMMUNITY): Payer: Self-pay | Admitting: Internal Medicine

## 2023-02-02 VITALS — BP 136/84 | HR 77 | Ht 71.0 in | Wt 203.6 lb

## 2023-02-02 DIAGNOSIS — I4819 Other persistent atrial fibrillation: Secondary | ICD-10-CM | POA: Insufficient documentation

## 2023-02-02 DIAGNOSIS — Z7901 Long term (current) use of anticoagulants: Secondary | ICD-10-CM | POA: Diagnosis not present

## 2023-02-02 DIAGNOSIS — I491 Atrial premature depolarization: Secondary | ICD-10-CM | POA: Insufficient documentation

## 2023-02-02 DIAGNOSIS — Z79899 Other long term (current) drug therapy: Secondary | ICD-10-CM | POA: Insufficient documentation

## 2023-02-02 DIAGNOSIS — I4891 Unspecified atrial fibrillation: Secondary | ICD-10-CM

## 2023-02-02 NOTE — Progress Notes (Signed)
Primary Care Physician: Sigmund Hazel, MD Primary Cardiologist: Dr. Royann Shivers Primary Electrophysiologist: Dr. Elberta Fortis Referring Physician: Dr. Wylie Hail III is a 52 y.o. male with a history of persistent atrial fibrillation who presents for consultation in the [facility]. He is s/p Afib ablation by Dr. Elberta Fortis on 01/01/23. He is on coreg 3.125 mg BID for rate control. Patient is on Eliquis 5 mg BID for a CHADS2VASC score of zero.  On follow up today, he is currently in NSR. He feels well since Afib ablation. S/p Afib ablation by Dr. Elberta Fortis on 01/01/23. He has not had any episodes of Afib since ablation. No chest pain, shortness of breath, or trouble swallowing. Leg sites healed without issue.   He is compliant with anticoagulation and has not missed any doses. He has no bleeding concerns.  Today, patient denies symptoms of palpitations, orthopnea, PND, lower extremity edema, dizziness, presyncope, syncope, snoring, daytime somnolence, bleeding, or neurologic sequela. The patient is tolerating medications without difficulties and is otherwise without complaint today.    he has a BMI of Body mass index is 28.4 kg/m.Marland Kitchen Filed Weights   02/02/23 1000  Weight: 92.4 kg    Family History  Problem Relation Age of Onset   Healthy Mother    Healthy Father    Healthy Brother      Atrial Fibrillation Management history:  Previous antiarrhythmic drugs: None Previous cardioversions: None Previous ablations: 01/01/23 Anticoagulation history: Eliquis 5 mg BID   Past Medical History:  Diagnosis Date   AKI (acute kidney injury) (HCC) 02/2016   Hypokalemia 02/2016   New onset atrial fibrillation (HCC) 02/2016   Past Surgical History:  Procedure Laterality Date   ATRIAL FIBRILLATION ABLATION N/A 01/01/2023   Procedure: ATRIAL FIBRILLATION ABLATION;  Surgeon: Regan Lemming, MD;  Location: [facility] INVASIVE CV LAB;  Service: Cardiovascular;   Laterality: N/A;   CARDIOVERSION N/A 03/24/2016   Procedure: CARDIOVERSION;  Surgeon: Thurmon Fair, MD;  Location: [facility] ENDOSCOPY;  Service: Cardiovascular;  Laterality: N/A;   CARDIOVERSION N/A 04/23/2016   Procedure: CARDIOVERSION;  Surgeon: Chilton Si, MD;  Location: [facility] ENDOSCOPY;  Service: Cardiovascular;  Laterality: N/A;   NO PAST SURGERIES     TEE WITHOUT CARDIOVERSION N/A 03/24/2016   Procedure: TRANSESOPHAGEAL ECHOCARDIOGRAM (TEE);  Surgeon: Thurmon Fair, MD;  Location: [facility] ENDOSCOPY;  Service: Cardiovascular;  Laterality: N/A;   TEE WITHOUT CARDIOVERSION N/A 12/29/2022   Procedure: TRANSESOPHAGEAL ECHOCARDIOGRAM;  Surgeon: Christell Constant, MD;  Location: [facility] INVASIVE CV LAB;  Service: Cardiovascular;  Laterality: N/A;    Current Outpatient Medications  Medication Sig Dispense Refill   acetaminophen (TYLENOL) 500 MG tablet Take 500 mg by mouth as needed.     apixaban (ELIQUIS) 5 MG TABS tablet Take 1 tablet (5 mg total) by mouth 2 (two) times daily. 60 tablet 3   carvedilol (COREG) 3.125 MG tablet Take 3.125 mg by mouth 2 (two) times daily.     No current facility-administered medications for this encounter.    No Known Allergies  Social History   Socioeconomic History   Marital status: Single    Spouse name: Not on file   Number of children: Not on file   Years of education: Not on file   Highest education level: Not on file  Occupational History   Occupation: Marketing executive  Tobacco Use   Smoking status: Never   Smokeless tobacco: Never  Vaping Use   Vaping Use: Never used  Substance and Sexual Activity   Alcohol use: No   Drug use: No   Sexual activity: Not on file  Other Topics Concern   Not on file  Social History Narrative   Pt lives alone in [place]. His stepfather died of an MI, but his biological father and his mother have no known CAD.   Social Determinants of Health   Financial Resource Strain: Not on file  Food  Insecurity: Not on file  Transportation Needs: Not on file  Physical Activity: Not on file  Stress: Not on file  Social Connections: Not on file  Intimate Partner Violence: Not on file    ROS- All systems are reviewed and negative except as per the HPI above.  Physical Exam: Vitals:   02/02/23 1000  BP: 136/84  Pulse: 77  Weight: 92.4 kg  Height: 5\' 11"  (1.803 m)    GEN- The patient is a well appearing male, alert and oriented x 3 today.   Head- normocephalic, atraumatic Eyes-  Sclera clear, conjunctiva pink Ears- hearing intact Oropharynx- clear Neck- supple  Lungs- Clear to ausculation bilaterally, normal work of breathing Heart- Regular rate and rhythm, no murmurs, rubs or gallops  GI- soft, NT, ND, + BS Extremities- no clubbing, cyanosis, or edema MS- no significant deformity or atrophy Skin- no rash or lesion Psych- euthymic mood, full affect Neuro- strength and sensation are intact  Wt Readings from Last 3 Encounters:  02/02/23 92.4 kg  01/01/23 93.9 kg  12/12/22 93.9 kg    EKG today demonstrates  Vent. rate 77 BPM PR interval 164 ms QRS duration 92 ms QT/QTcB 364/411 ms P-R-T axes -17 66 134 Sinus rhythm with Premature supraventricular complexes T wave abnormality, consider lateral ischemia Abnormal ECG When compared with ECG of 01-Jan-2023 11:00, PREVIOUS ECG IS PRESENT  Echo 01/01/23 demonstrated:  1. Left ventricular ejection fraction, by estimation, is 55 to 60%. The  left ventricle has normal function. Left ventricular diastolic function  could not be evaluated.   2. Right ventricular systolic function was not well visualized. The right  ventricular size is not well visualized.   3. Left atrial size was mildly dilated. No left atrial/left atrial  appendage thrombus was detected using definty contrast.   4. Right atrial size was mildly dilated.   5. The mitral valve is normal in structure. Mild mitral valve  regurgitation.   6. The aortic valve  is tricuspid. Aortic valve regurgitation is trivial.   Epic records are reviewed at length today.  CHA2DS2-VASc Score = 0  The patient's score is based upon: CHF History: 0 HTN History: 0 Diabetes History: 0 Stroke History: 0 Vascular Disease History: 0 Age Score: 0 Gender Score: 0       ASSESSMENT AND PLAN: Persistent Atrial Fibrillation (ICD10:  I48.19) The patient's CHA2DS2-VASc score is 0, indicating a 0.2% annual risk of stroke.    S/p Afib ablation on 01/01/23. He is currently in NSR doing well.  Continue coreg and Eliquis BID without interruption.   Follow up as scheduled with Dr. Elberta Fortis.   [place] Bells, PA-C Afib Clinic [facility] [address] [place], [place] [address] [phone] 02/02/2023 10:29 AM

## 2023-02-12 ENCOUNTER — Telehealth: Payer: Self-pay | Admitting: Cardiology

## 2023-02-12 MED ORDER — APIXABAN 5 MG PO TABS: 5.0000 mg | ORAL_TABLET | Freq: Two times a day (BID) | ORAL | 0 refills | Status: DC

## 2023-02-12 NOTE — Telephone Encounter (Signed)
  Pt requesting a callback from [place], he said, he will talk to her what it is about.

## 2023-02-12 NOTE — Telephone Encounter (Signed)
Pt reports he has changed insurances and he needs another copay card b/c he cannot afford 600 for a month. Aware I am leaving 2 weeks of samples (as he is about to be out of this medication) and $10 co pay card. He appreciates the help with this.

## 2023-03-09 ENCOUNTER — Encounter: Payer: Self-pay | Admitting: Cardiology

## 2023-03-09 ENCOUNTER — Other Ambulatory Visit: Payer: Self-pay

## 2023-03-09 MED ORDER — CARVEDILOL 3.125 MG PO TABS: 3.1250 mg | ORAL_TABLET | Freq: Two times a day (BID) | ORAL | 1 refills | Status: DC

## 2023-04-14 ENCOUNTER — Ambulatory Visit: Payer: 59 | Attending: Cardiology | Admitting: Cardiology

## 2023-04-14 ENCOUNTER — Encounter: Payer: Self-pay | Admitting: Cardiology

## 2023-04-14 VITALS — BP 134/84 | HR 86 | Ht 71.0 in | Wt 204.2 lb

## 2023-04-14 DIAGNOSIS — I4819 Other persistent atrial fibrillation: Secondary | ICD-10-CM

## 2023-04-14 NOTE — Progress Notes (Signed)
Stop Eliquis for the A-fib clinic for so today Electrophysiology Office Note:   Date:  04/14/2023  ID:  Mark Ponce, DOB 05-Jul-1971, MRN [id number]  Primary Cardiologist: None Electrophysiologist: Kenslie Abbruzzese [name], MD      History of Present Illness:   Mark Ponce is a 52 y.o. male with h/o atrial fibrillation seen today for routine electrophysiology followup.  Since last being seen in our clinic the patient reports doing well.  He has noted no further episodes of atrial fibrillation.  He had some mild chest discomfort and the month after the ablation, but otherwise has no major complaints.  He is able to do all of his daily activities.  He feels much improved with less fatigue and shortness of breath.  he denies chest pain, palpitations, dyspnea, PND, orthopnea, nausea, vomiting, dizziness, syncope, edema, weight gain, or early satiety.   Review of systems complete and found to be negative unless listed in HPI.   EP Information / Studies Reviewed:    EKG is ordered today. Personal review as below.  EKG Interpretation Date/Time:  Tuesday April 14 2023 14:52:46 EDT Ventricular Rate:  86 PR Interval:  174 QRS Duration:  80 QT Interval:  340 QTC Calculation: 406 R Axis:   68  Text Interpretation: Junctional rhythm T wave abnormality, consider lateral ischemia When compared with ECG of 02-Feb-2023 10:06, Junctional rhythm Confirmed by Trenity Pha ([id number]) on 04/14/2023 2:59:15 PM     Risk Assessment/Calculations:    CHA2DS2-VASc Score = 0   This indicates a 0.2% annual risk of stroke. The patient's score is based upon: CHF History: 0 HTN History: 0 Diabetes History: 0 Stroke History: 0 Vascular Disease History: 0 Age Score: 0 Gender Score: 0              Physical Exam:   VS:  BP 134/84 (BP Location: Left Arm, Patient Position: Sitting, Cuff Size: Large)   Pulse 86   Ht 5\' 11"  (1.803 m)   Wt 204 lb 3.2 oz (92.6 kg)   SpO2 96%   BMI 28.48 kg/m     Wt Readings from Last 3 Encounters:  04/14/23 204 lb 3.2 oz (92.6 kg)  02/02/23 203 lb 9.6 oz (92.4 kg)  01/01/23 207 lb (93.9 kg)     GEN: Well nourished, well developed in no acute distress NECK: No JVD; No carotid bruits CARDIAC: Regular rate and rhythm, no murmurs, rubs, gallops RESPIRATORY:  Clear to auscultation without rales, wheezing or rhonchi  ABDOMEN: Soft, non-tender, non-distended EXTREMITIES:  No edema; No deformity   ASSESSMENT AND PLAN:    1.  Persistent atrial fibrillation: Currently on Eliquis.  Status post ablation 01/01/2023.  He remains in sinus rhythm.  He has had no further episodes of atrial fibrillation.  Cadence Minton stop Eliquis today.  Follow up with Afib Clinic in 6 months  Signed, Elbridge Magowan [name], MD

## 2023-04-14 NOTE — Patient Instructions (Signed)
Medication Instructions:  Your physician has recommended you make the following change in your medication: STOP Eliquis  *If you need a refill on your cardiac medications before your next appointment, please call your pharmacy*   Lab Work: None ordered   Testing/Procedures: None ordered   Follow-Up: At [facility], you and your health needs are our priority.  As part of our continuing mission to provide you with exceptional heart care, we have created designated Provider Care Teams.  These Care Teams include your primary Cardiologist (physician) and Advanced Practice Providers (APPs -  Physician Assistants and Nurse Practitioners) who all work together to provide you with the care you need, when you need it.   Your next appointment:   6 month(s)  The format for your next appointment:   In Person  Provider:   You will follow up in the Atrial Fibrillation Clinic located at [facility]. Your provider will be: Landry Mellow, PA   Thank you for choosing [facility]!!   Dory Horn, RN [phone]

## 2023-04-18 ENCOUNTER — Other Ambulatory Visit: Payer: Self-pay | Admitting: Student

## 2023-06-04 ENCOUNTER — Other Ambulatory Visit: Payer: Self-pay | Admitting: Student

## 2023-10-19 ENCOUNTER — Ambulatory Visit (HOSPITAL_COMMUNITY)
Admission: RE | Admit: 2023-10-19 | Discharge: 2023-10-19 | Disposition: A | Discharge status: Home / Self Care | Payer: 59 | Source: Ambulatory Visit | Attending: Internal Medicine | Admitting: Internal Medicine

## 2023-10-19 VITALS — BP 130/86 | HR 69 | Ht 71.0 in | Wt 215.6 lb

## 2023-10-19 DIAGNOSIS — Z79899 Other long term (current) drug therapy: Secondary | ICD-10-CM | POA: Diagnosis not present

## 2023-10-19 DIAGNOSIS — Z7901 Long term (current) use of anticoagulants: Secondary | ICD-10-CM | POA: Insufficient documentation

## 2023-10-19 DIAGNOSIS — I4819 Other persistent atrial fibrillation: Secondary | ICD-10-CM | POA: Insufficient documentation

## 2023-10-19 NOTE — Progress Notes (Signed)
 Primary Care Physician: Sigmund Hazel, MD Primary Cardiologist: Dr. Royann Shivers Primary Electrophysiologist: Dr. Elberta Fortis Referring Physician: Dr. Wylie Hail III is a 53 y.o. male with a history of persistent atrial fibrillation who presents for consultation in the [facility]. He is s/p Afib ablation by Dr. Elberta Fortis on 01/01/23. He is on coreg 3.125 mg BID for rate control. Patient is on Eliquis 5 mg BID for a CHADS2VASC score of zero.  On follow up today, he is currently in NSR. He feels well since Afib ablation. S/p Afib ablation by Dr. Elberta Fortis on 01/01/23. He has not had any episodes of Afib since ablation. No chest pain, shortness of breath, or trouble swallowing. Leg sites healed without issue.   He is compliant with anticoagulation and has not missed any doses. He has no bleeding concerns.  On follow up 10/19/23, he is currently in NSR. He has had no episodes of Afib since last office visit with Dr. Elberta Fortis. He is not on OAC due to risk score of zero.   Today, patient denies symptoms of palpitations, orthopnea, PND, lower extremity edema, dizziness, presyncope, syncope, snoring, daytime somnolence, bleeding, or neurologic sequela. The patient is tolerating medications without difficulties and is otherwise without complaint today.    he has a BMI of Body mass index is 30.07 kg/m.Marland Kitchen Filed Weights   10/19/23 0828  Weight: 97.8 kg     Family History  Problem Relation Age of Onset   Healthy Mother    Healthy Father    Healthy Brother     Atrial Fibrillation Management history:  Previous antiarrhythmic drugs: None Previous cardioversions: None Previous ablations: 01/01/23 Anticoagulation history: Eliquis 5 mg BID   Past Medical History:  Diagnosis Date   AKI (acute kidney injury) (HCC) 02/2016   Hypokalemia 02/2016   New onset atrial fibrillation (HCC) 02/2016   Past Surgical History:  Procedure Laterality Date   ATRIAL FIBRILLATION  ABLATION N/A 01/01/2023   Procedure: ATRIAL FIBRILLATION ABLATION;  Surgeon: Regan Lemming, MD;  Location: [facility]AS[facility] CV [facility];  Service: Cardiovascular;  Laterality: N/A;   CARDIOVERSION N/A 03/24/2016   Procedure: CARDIOVERSION;  Surgeon: Thurmon Fair, MD;  Location: [facility] ENDOSCOPY;  Service: Cardiovascular;  Laterality: N/A;   CARDIOVERSION N/A 04/23/2016   Procedure: CARDIOVERSION;  Surgeon: Chilton Si, MD;  Location[facility] ENDOSCOPY;  Service: Cardiovascular;  Laterality: N/A;   NO PAST SURGERIES     TEE WITHOUT CARDIOVERSION N/A 03/24/2016   Procedure: TRANSESOPHAGEAL ECHOCARDIOGRAM (TEE);  Surgeon: Thurmon Fair, MD;  Location: [facility] ENDOSCOPY;  Service: Cardiovascular;  Laterality: N/A;   TEE WITHOUT CARDIOVERSION N/A 12/29/2022   Procedure: TRANSESOPHAGEAL ECHOCARDIOGRAM;  Surgeon: Christell Constant, MD;  Location: [facility] INVAS[facility] CV LAB;  Service: Cardiovascular;  Laterality: N/A;    Current Outpatient Medications  Medication Sig Dispense Refill   acetaminophen (TYLENOL) 500 MG tablet Take 500 mg by mouth as needed.     No current facility-administered medications for this encounter.    No Known Allergies  ROS- All systems are reviewed and negative except as per the HPI above.  Physical Exam: Vitals:   10/19/23 0828  BP: 130/86  Pulse: 69  Weight: 97.8 kg  Height: 5\' 11"  (1.803 m)    GEN- The patient is well appearing, alert and oriented x 3 today.   Neck - no JVD or carotid bruit noted Lungs- Clear to ausculation bilaterally, normal work of breathing Heart- Regular rate and rhythm, no murmurs, rubs or gallops,  PMI not laterally displaced Extremities- no clubbing, cyanosis, or edema Skin - no rash or ecchymosis noted   Wt Readings from Last 3 Encounters:  10/19/23 97.8 kg  04/14/23 92.6 kg  02/02/23 92.4 kg    EKG today demonstrates  Vent. rate 69 BPM PR interval 160 ms QRS duration 78 ms QT/QTcB 370/396 ms P-R-T axes 64 67 121 Normal sinus rhythm with  sinus arrhythmia T wave abnormality, consider lateral ischemia Abnormal ECG When compared with ECG of 14-Apr-2023 14:52, PREVIOUS ECG IS PRESENT  Echo 01/01/23 demonstrated:  1. Left ventricular ejection fraction, by estimation, is 55 to 60%. The  left ventricle has normal function. Left ventricular diastolic function  could not be evaluated.   2. Right ventricular systolic function was not well visualized. The right  ventricular size is not well visualized.   3. Left atrial size was mildly dilated. No left atrial/left atrial  appendage thrombus was detected using definty contrast.   4. Right atrial size was mildly dilated.   5. The mitral valve is normal in structure. Mild mitral valve  regurgitation.   6. The aortic valve is tricuspid. Aortic valve regurgitation is trivial.   Epic records are reviewed at length today.  CHA2DS2-VASc Score = 0  The patient's score is based upon: CHF History: 0 HTN History: 0 Diabetes History: 0 Stroke History: 0 Vascular Disease History: 0 Age Score: 0 Gender Score: 0       ASSESSMENT AND PLAN: Persistent Atrial Fibrillation (ICD10:  I48.19) The patient's CHA2DS2-VASc score is 0, indicating a 0.2% annual risk of stroke.   S/p Afib ablation on 01/01/23.   He is currently in NSR. Patient would like to stop carvedilol. I am agreeable to this but have recommended patient to check his BP for the next couple of weeks after discontinuation. If BP is consistently elevated > 130/80 recommend to restart.     Follow up 1 year with Dr. Elberta Fortis.   Lake Bells, PA-C Afib Clinic [facility] [address] [place], [place] [address] [phone] 10/19/2023 9:13 AM

## 2023-10-19 NOTE — Patient Instructions (Addendum)
 Follow up with Dr Elberta Fortis in 1 year   Stop coreg
